# Patient Record
Sex: Male | Born: 1950 | Hispanic: No | Marital: Married | Smoking: Never smoker
Health system: Southern US, Community
[De-identification: ages and names within clinical notes are randomized; demographics above are authoritative.]

## PROBLEM LIST (undated history)

## (undated) DIAGNOSIS — I10 Essential (primary) hypertension: Secondary | ICD-10-CM

## (undated) DIAGNOSIS — E7800 Pure hypercholesterolemia, unspecified: Secondary | ICD-10-CM

## (undated) HISTORY — PX: HERNIA REPAIR: SHX51

---

## 2006-06-21 ENCOUNTER — Ambulatory Visit: Payer: Self-pay | Admitting: Gastroenterology

## 2011-06-06 ENCOUNTER — Ambulatory Visit: Payer: Self-pay | Admitting: Surgery

## 2011-06-06 DIAGNOSIS — I10 Essential (primary) hypertension: Secondary | ICD-10-CM

## 2011-06-13 ENCOUNTER — Ambulatory Visit: Payer: Self-pay | Admitting: Surgery

## 2012-03-20 ENCOUNTER — Ambulatory Visit: Payer: Self-pay | Admitting: Gastroenterology

## 2012-03-21 LAB — PATHOLOGY REPORT

## 2014-08-02 NOTE — Op Note (Signed)
PATIENT NAME:  Samuel Arnold, Samuel Arnold MR#:  [id number] DATE OF BIRTH:  [birth year]  DATE OF PROCEDURE:  06/13/2011  PREOPERATIVE DIAGNOSIS: Left inguinal hernia.   POSTOPERATIVE DIAGNOSIS: Left inguinal hernia.   PROCEDURE: Left inguinal hernia repair.   SURGEON: Rochel Brome, MD.   ANESTHESIA: General.   INDICATIONS: This 64 year old male came into the office with chief complaint of a large bulge in the left groin. He had a very large left inguinal hernia which at the time could not be reduced. It was some 18 cm in dimension and repair is recommended for definitive treatment.   DESCRIPTION OF PROCEDURE: The patient was placed on the operating table in the supine position under general anesthesia. The hernia at this time was reduced. The left lower abdomen was clipped and prepared with ChloraPrep and draped in a sterile manner. A left lower quadrant transversely oriented suprapubic incision was made approximately 8 cm in length, carried down through subcutaneous tissues. Two traversing veins were divided between 4-0 Vicryl ligatures. Scarpa's fascia was incised. The external oblique aponeurosis was incised along the course of its fibers to open the external ring and expose the inguinal cord structures. Cremaster fibers were spread to expose an indirect hernia sac which was dissected free from surrounding structures. The sac was approximately 8 inches in length. It was opened. Its continuity with the peritoneal cavity was demonstrated. There was some small bowel within the sac. The sac was dissected up well into the internal ring and then a high ligation of the sac was done with 0 Surgilon and then 0 Surgilon suture ligature. The sac was excised and did not need pathologic examination. The stump was allowed to retract. A cord lipoma was also ligated with 4-0 Vicryl and amputated. The cord structures were further mobilized. There was some mild weakness in the floor of the inguinal canal. A row of sutures were  placed using 0 Surgilon to suture the conjoined tendon to the shelving edge of the inguinal ligament incorporating transversalis fascia into the repair. The last stitch led to satisfactory narrowing of the internal ring. A medial relaxing incision was made in the internal oblique fascia overlying the distal rectus muscle. Next, an Atrium mesh was cut to create an oval shape of some 2.5 x 4 cm with a notch cut out for the internal ring. This was placed straddling the internal ring and sutured to the repair with 0 Surgilon. The repair looked good. Hemostasis was intact. The cord structures and the ilioinguinal nerve were placed along the floor of the inguinal canal. Cut edges of the external oblique aponeurosis were approximated with a running 4-0 Monocryl. The deep fascia superior and lateral to the repair site was infiltrated with 0.5% Sensorcaine with epinephrine. Subcutaneous tissues were infiltrated using a total of 18 mL. The Scarpa's fascia was closed with interrupted 4-0 Monocryl. The skin was closed with running 4-0 Monocryl subcuticular suture and Dermabond. The testicle remained in the scrotum. The patient tolerated surgery satisfactorily and was then prepared for transfer to the recovery room.  ____________________________ Lenna Sciara. Rochel Brome, MD jws:ap D: 06/13/2011 09:17:21 ET T: 06/13/2011 11:25:59 ET JOB#: [id number]  cc: Loreli Dollar, MD, <Dictator> Loreli Dollar MD ELECTRONICALLY SIGNED 06/16/2011 9:40

## 2015-10-26 ENCOUNTER — Encounter: Payer: Self-pay | Admitting: *Deleted

## 2015-10-27 ENCOUNTER — Encounter: Payer: Self-pay | Admitting: *Deleted

## 2015-10-27 ENCOUNTER — Encounter
Admission: RE | Disposition: A | Discharge status: Home / Self Care | Payer: Self-pay | Source: Ambulatory Visit | Attending: Unknown Physician Specialty

## 2015-10-27 ENCOUNTER — Ambulatory Visit: Payer: Medicare Other | Admitting: Anesthesiology

## 2015-10-27 ENCOUNTER — Ambulatory Visit
Admission: RE | Admit: 2015-10-27 | Discharge: 2015-10-27 | Disposition: A | Discharge status: Home / Self Care | Payer: Medicare Other | Source: Ambulatory Visit | Attending: Unknown Physician Specialty | Admitting: Unknown Physician Specialty

## 2015-10-27 DIAGNOSIS — E7800 Pure hypercholesterolemia, unspecified: Secondary | ICD-10-CM | POA: Insufficient documentation

## 2015-10-27 DIAGNOSIS — Z7982 Long term (current) use of aspirin: Secondary | ICD-10-CM | POA: Insufficient documentation

## 2015-10-27 DIAGNOSIS — D122 Benign neoplasm of ascending colon: Secondary | ICD-10-CM | POA: Diagnosis not present

## 2015-10-27 DIAGNOSIS — K640 First degree hemorrhoids: Secondary | ICD-10-CM | POA: Insufficient documentation

## 2015-10-27 DIAGNOSIS — Z9889 Other specified postprocedural states: Secondary | ICD-10-CM | POA: Diagnosis not present

## 2015-10-27 DIAGNOSIS — Z86010 Personal history of colonic polyps: Secondary | ICD-10-CM | POA: Insufficient documentation

## 2015-10-27 DIAGNOSIS — Z1211 Encounter for screening for malignant neoplasm of colon: Secondary | ICD-10-CM | POA: Diagnosis not present

## 2015-10-27 DIAGNOSIS — I10 Essential (primary) hypertension: Secondary | ICD-10-CM | POA: Diagnosis not present

## 2015-10-27 DIAGNOSIS — Z79899 Other long term (current) drug therapy: Secondary | ICD-10-CM | POA: Diagnosis not present

## 2015-10-27 HISTORY — DX: Essential (primary) hypertension: I10

## 2015-10-27 HISTORY — DX: Pure hypercholesterolemia, unspecified: E78.00

## 2015-10-27 HISTORY — PX: COLONOSCOPY WITH PROPOFOL: SHX5780

## 2015-10-27 SURGERY — COLONOSCOPY WITH PROPOFOL
Anesthesia: General

## 2015-10-27 MED ORDER — EPHEDRINE SULFATE 50 MG/ML IJ SOLN
INTRAMUSCULAR | Status: DC | PRN
  Administered 2015-10-27: 10 mg via INTRAVENOUS

## 2015-10-27 MED ORDER — LIDOCAINE HCL (PF) 2 % IJ SOLN
INTRAMUSCULAR | Status: DC | PRN
  Administered 2015-10-27: 50 mg

## 2015-10-27 MED ORDER — FENTANYL CITRATE (PF) 100 MCG/2ML IJ SOLN
INTRAMUSCULAR | Status: DC | PRN
  Administered 2015-10-27: 50 ug via INTRAVENOUS

## 2015-10-27 MED ORDER — SODIUM CHLORIDE 0.9 % IV SOLN
INTRAVENOUS | Status: DC
  Administered 2015-10-27: 1000 mL via INTRAVENOUS

## 2015-10-27 MED ORDER — SODIUM CHLORIDE 0.9 % IV SOLN
INTRAVENOUS | Status: DC
  Administered 2015-10-27: 09:00:00 via INTRAVENOUS

## 2015-10-27 MED ORDER — PROPOFOL 10 MG/ML IV BOLUS
INTRAVENOUS | Status: DC | PRN
  Administered 2015-10-27: 40 mg via INTRAVENOUS

## 2015-10-27 MED ORDER — PROPOFOL 500 MG/50ML IV EMUL
INTRAVENOUS | Status: DC | PRN
  Administered 2015-10-27: 75 ug/kg/min via INTRAVENOUS

## 2015-10-27 MED ORDER — MIDAZOLAM HCL 5 MG/5ML IJ SOLN
INTRAMUSCULAR | Status: DC | PRN
  Administered 2015-10-27: 1 mg via INTRAVENOUS

## 2015-10-27 NOTE — Anesthesia Preprocedure Evaluation (Signed)
Anesthesia Evaluation  Patient identified by MRN, date of birth, ID band Patient awake    Reviewed: Allergy & Precautions, H&P , NPO status , Patient's Chart, lab work & pertinent test results, reviewed documented beta blocker date and time   History of Anesthesia Complications Negative for: history of anesthetic complications  Airway Mallampati: III  TM Distance: >3 FB Neck ROM: full    Dental no notable dental hx. (+) Partial Upper, Partial Lower   Pulmonary neg pulmonary ROS,    Pulmonary exam normal breath sounds clear to auscultation       Cardiovascular Exercise Tolerance: Good hypertension, On Medications (-) angina(-) CAD, (-) Past MI, (-) Cardiac Stents and (-) CABG Normal cardiovascular exam(-) dysrhythmias (-) Valvular Problems/Murmurs Rhythm:regular Rate:Normal     Neuro/Psych negative neurological ROS  negative psych ROS   GI/Hepatic negative GI ROS, Neg liver ROS,   Endo/Other  negative endocrine ROS  Renal/GU negative Renal ROS  negative genitourinary   Musculoskeletal   Abdominal   Peds  Hematology negative hematology ROS (+)   Anesthesia Other Findings Past Medical History:   Hypertension                                                 Hypercholesteremia                                           Reproductive/Obstetrics negative OB ROS                             Anesthesia Physical Anesthesia Plan  ASA: II  Anesthesia Plan: General   Post-op Pain Management:    Induction:   Airway Management Planned:   Additional Equipment:   Intra-op Plan:   Post-operative Plan:   Informed Consent: I have reviewed the patients History and Physical, chart, labs and discussed the procedure including the risks, benefits and alternatives for the proposed anesthesia with the patient or authorized representative who has indicated his/her understanding and acceptance.   Dental  Advisory Given  Plan Discussed with: Anesthesiologist, CRNA and Surgeon  Anesthesia Plan Comments:         Anesthesia Quick Evaluation

## 2015-10-27 NOTE — Transfer of Care (Signed)
Immediate Anesthesia Transfer of Care Note  Patient: Samuel Arnold  Procedure(s) Performed: Procedure(s): COLONOSCOPY WITH PROPOFOL (N/A)  Patient Location: PACU  Anesthesia Type:General  Level of Consciousness: sedated  Airway & Oxygen Therapy: Patient Spontanous Breathing and Patient connected to nasal cannula oxygen  Post-op Assessment: Report given to RN and Post -op Vital signs reviewed and stable  Post vital signs: Reviewed and stable  Last Vitals:  Filed Vitals:   10/27/15 0808 10/27/15 0900  BP: 166/77 104/68  Pulse: 56 53  Temp: 36.8 C 36.4 C  Resp: 16 18    Last Pain: There were no vitals filed for this visit.       Complications: No apparent anesthesia complications

## 2015-10-27 NOTE — H&P (Signed)
   Primary Care Physician:  [facility] Primary Gastroenterologist:  Dr. Vira Agar  Pre-Procedure History & Physical: HPI:  Samuel Arnold is a 65 y.o. male is here for an colonoscopy.   Past Medical History  Diagnosis Date  . Hypertension   . Hypercholesteremia     Past Surgical History  Procedure Laterality Date  . Hernia repair      Prior to Admission medications   Medication Sig Start Date End Date Taking? Authorizing Provider  aspirin 81 MG tablet Take 81 mg by mouth daily.   Yes Historical Provider, MD  atenolol (TENORMIN) 25 MG tablet Take by mouth daily.   Yes Historical Provider, MD  chlorthalidone (HYGROTON) 25 MG tablet Take 25 mg by mouth daily.   Yes Historical Provider, MD  lovastatin (MEVACOR) 40 MG tablet Take 40 mg by mouth at bedtime.   Yes Historical Provider, MD  sildenafil (VIAGRA) 100 MG tablet Take 100 mg by mouth daily as needed for erectile dysfunction.   Yes Historical Provider, MD    Allergies as of 09/30/2015  . (Not on File)    History reviewed. No pertinent family history.  Social History   Social History  . Marital Status: Married    Spouse Name: N/A  . Number of Children: N/A  . Years of Education: N/A   Occupational History  . Not on file.   Social History Main Topics  . Smoking status: Never Smoker   . Smokeless tobacco: Not on file  . Alcohol Use: Not on file  . Drug Use: Not on file  . Sexual Activity: Not on file   Other Topics Concern  . Not on file   Social History Narrative    Review of Systems: See HPI, otherwise negative ROS  Physical Exam: BP 166/77 mmHg  Pulse 56  Temp(Src) 98.2 F (36.8 C) (Oral)  Resp 16  Ht 5\' 7"  (1.702 m)  Wt 90.719 kg (200 lb)  BMI 31.32 kg/m2  SpO2 100% General:   Alert,  pleasant and cooperative in NAD Head:  Normocephalic and atraumatic. Neck:  Supple; no masses or thyromegaly. Lungs:  Clear throughout to auscultation.    Heart:  Regular rate and  rhythm. Abdomen:  Soft, nontender and nondistended. Normal bowel sounds, without guarding, and without rebound.   Neurologic:  Alert and  oriented x4;  grossly normal neurologically.  Impression/Plan: Samuel Arnold is here for an colonoscopy to be performed for [facility] colon polyps  Risks, benefits, limitations, and alternatives regarding  colonoscopy have been reviewed with the patient.  Questions have been answered.  All parties agreeable.   Gaylyn Cheers, MD  10/27/2015, 8:23 AM

## 2015-10-27 NOTE — Anesthesia Postprocedure Evaluation (Signed)
Anesthesia Post Note  Patient: Samuel Arnold  Procedure(s) Performed: Procedure(s) (LRB): COLONOSCOPY WITH PROPOFOL (N/A)  Patient location during evaluation: Endoscopy Anesthesia Type: General Level of consciousness: awake and alert Pain management: pain level controlled Vital Signs Assessment: post-procedure vital signs reviewed and stable Respiratory status: spontaneous breathing, nonlabored ventilation, respiratory function stable and patient connected to nasal cannula oxygen Cardiovascular status: blood pressure returned to baseline and stable Postop Assessment: no signs of nausea or vomiting Anesthetic complications: no    Last Vitals:  Filed Vitals:   10/27/15 0920 10/27/15 0930  BP: 135/74 137/75  Pulse: 53 54  Temp:    Resp: 12 15    Last Pain: There were no vitals filed for this visit.               Martha Clan

## 2015-10-27 NOTE — Op Note (Signed)
[facility] Gastroenterology Patient Name: Samuel Arnold Procedure Date: 10/27/2015 8:32 AM MRN: [id number] Account #: [id number] Date of Birth: Aug 16, 1950 Admit Type: Outpatient Age: 65 Room: [facility] ENDO ROOM 4 Gender: Male Note Status: Finalized Procedure:            Colonoscopy Indications:          High risk colon cancer surveillance: Personal history                        of colonic polyps Providers:            Manya Silvas, MD Referring MD:         Holly Bodily, MD Medicines:            Propofol per Anesthesia Complications:        No immediate complications. Procedure:            Pre-Anesthesia Assessment:                       - After reviewing the risks and benefits, the patient                        was deemed in satisfactory condition to undergo the                        procedure.                       After obtaining informed consent, the colonoscope was                        passed under direct vision. Throughout the procedure,                        the patient's blood pressure, pulse, and oxygen                        saturations were monitored continuously. The                        Colonoscope was introduced through the anus and                        advanced to the the cecum, identified by appendiceal                        orifice and ileocecal valve. The colonoscopy was                        performed without difficulty. The patient tolerated the                        procedure well. The quality of the bowel preparation                        was good. Findings:      A small polyp was found in the ascending colon. The polyp was sessile.       The polyp was removed with a hot snare. Resection and retrieval were       complete.      Internal hemorrhoids were found during endoscopy. The hemorrhoids  were       small and Grade I (internal hemorrhoids that do not prolapse).      The exam was otherwise without abnormality. Impression:            - One small polyp in the ascending colon, removed with                        a hot snare. Resected and retrieved.                       - Internal hemorrhoids.                       - The examination was otherwise normal. Recommendation:       - Await pathology results. Manya Silvas, MD 10/27/2015 8:59:30 AM This report has been signed electronically. Number of Addenda: 0 Note Initiated On: 10/27/2015 8:32 AM Scope Withdrawal Time: 0 hours 11 minutes 39 seconds  Total Procedure Duration: 0 hours 19 minutes 50 seconds       [facility]

## 2015-10-28 ENCOUNTER — Encounter: Payer: Self-pay | Admitting: Unknown Physician Specialty

## 2015-10-28 LAB — SURGICAL PATHOLOGY

## 2017-02-06 ENCOUNTER — Ambulatory Visit: Payer: Medicare HMO | Admitting: Urology

## 2017-02-06 ENCOUNTER — Encounter: Payer: Self-pay | Admitting: Urology

## 2017-02-06 VITALS — BP 178/93 | HR 69 | Ht 67.0 in | Wt 215.0 lb

## 2017-02-06 DIAGNOSIS — R972 Elevated prostate specific antigen [PSA]: Secondary | ICD-10-CM | POA: Diagnosis not present

## 2017-02-06 LAB — URINALYSIS, COMPLETE
BILIRUBIN UA: NEGATIVE
Glucose, UA: NEGATIVE
Ketones, UA: NEGATIVE
Leukocytes, UA: NEGATIVE
Nitrite, UA: NEGATIVE
PH UA: 7 (ref 5.0–7.5)
PROTEIN UA: NEGATIVE
Specific Gravity, UA: <1.005 — ABNORMAL LOW (ref 1.005–1.030)
Urobilinogen, Ur: 0.2 mg/dL (ref 0.2–1.0)

## 2017-02-06 MED ORDER — TAMSULOSIN HCL 0.4 MG PO CAPS: 0.4000 mg | ORAL_CAPSULE | Freq: Every day | ORAL | 0 refills | Status: DC

## 2017-02-06 NOTE — Progress Notes (Signed)
02/06/2017 3:11 PM   Samuel Arnold 1950-11-11 [id number]  Referring provider: [facility] [place], [place] [address]  Chief Complaint  Patient presents with  . Elevated PSA    New Patient    HPI: Samuel Arnold is a 66 y.o. male seen in consultation at the request of Burman Freestone for evaluation of an elevated PSA.  Recent PSA was elevated at 6.1.  No previous PSA results were sent for comparison however the patient states he was told his PSA had approximately doubled from last year.  He denies previous history of prior urologic evaluation and has no bothersome lower urinary tract symptoms.  He denies dysuria or gross hematuria.  Denies flank, abdominal, pelvic or scrotal pain.  He uses sildenafil as needed for erectile dysfunction.  He rates the severity of his problem as mild.   PMH: Past Medical History:  Diagnosis Date  . Hypercholesteremia   . Hypertension     Surgical History: Past Surgical History:  Procedure Laterality Date  . COLONOSCOPY WITH PROPOFOL N/A 10/27/2015   Procedure: COLONOSCOPY WITH PROPOFOL;  Surgeon: Manya Silvas, MD;  Location: [facility] ENDOSCOPY;  Service: Endoscopy;  Laterality: N/A;  . HERNIA REPAIR      Home Medications:  Allergies as of 02/06/2017   No Known Allergies     Medication List       Accurate as of 02/06/17  3:11 PM. Always use your most recent med list.          aspirin 81 MG tablet Take 81 mg by mouth daily.   atenolol 25 MG tablet Commonly known as:  TENORMIN Take by mouth daily.   chlorthalidone 25 MG tablet Commonly known as:  HYGROTON Take 25 mg by mouth daily.   lovastatin 40 MG tablet Commonly known as:  MEVACOR Take 40 mg by mouth at bedtime.   sildenafil 100 MG tablet Commonly known as:  VIAGRA Take 100 mg by mouth daily as needed for erectile dysfunction.       Allergies: No Known Allergies  Family History: Family History  Problem Relation Age of Onset  .  Bladder Cancer Neg Hx   . Prostate cancer Neg Hx   . Kidney cancer Neg Hx     Social History:  reports that he has never smoked. He has never used smokeless tobacco. He reports that he does not drink alcohol or use drugs.  ROS: UROLOGY Frequent Urination?: Yes Hard to postpone urination?: No Burning/pain with urination?: No Get up at night to urinate?: Yes Leakage of urine?: No Urine stream starts and stops?: No Trouble starting stream?: No Do you have to strain to urinate?: No Blood in urine?: No Urinary tract infection?: No Sexually transmitted disease?: No Injury to kidneys or bladder?: No Painful intercourse?: No Weak stream?: No Erection problems?: No Penile pain?: No  Gastrointestinal Nausea?: No Vomiting?: No Indigestion/heartburn?: No Diarrhea?: No Constipation?: No  Constitutional Fever: No Night sweats?: No Weight loss?: No Fatigue?: No  Skin Skin rash/lesions?: No Itching?: No  Eyes Blurred vision?: No Double vision?: No  Ears/Nose/Throat Sore throat?: No Sinus problems?: No  Hematologic/Lymphatic Swollen glands?: No Easy bruising?: No  Cardiovascular Leg swelling?: No Chest pain?: No  Respiratory Cough?: No Shortness of breath?: No  Endocrine Excessive thirst?: No  Musculoskeletal Back pain?: No Joint pain?: No  Neurological Headaches?: No Dizziness?: No  Psychologic Depression?: No Anxiety?: No  Physical Exam: BP (!) 178/93 (BP Location: Left Arm, Patient Position:  Sitting, Cuff Size: Large)   Pulse 69   Ht 5\' 7"  (1.702 m)   Wt 215 lb (97.5 kg)   BMI 33.67 kg/m   Constitutional:  Alert and oriented, No acute distress. HEENT: [place] AT, moist mucus membranes.  Trachea midline, no masses. Cardiovascular: No clubbing, cyanosis, or edema. Respiratory: Normal respiratory effort, no increased work of breathing. GI: Abdomen is soft, nontender, nondistended, no abdominal masses GU: No CVA tenderness.  Penis uncircumcised  without lesions, testes descended bilaterally without masses, cord/epididymes palpably normal.  Prostate at least 30 g, smooth.  Unable to palpate upper one half of gland. Skin: No rashes, bruises or suspicious lesions. Lymph: No cervical or inguinal adenopathy. Neurologic: Grossly intact, no focal deficits, moving all 4 extremities. Psychiatric: Normal mood and affect.  Laboratory Data:  Urinalysis Dipstick and microscopic negative    Assessment & Plan:   1. Elevated PSA Although PSA is a prostate cancer screening test he was informed that cancer is not the most common cause of an elevated PSA. Other potential causes including BPH and inflammation were discussed. He was informed that the only way to adequately diagnose prostate cancer would be a transrectal ultrasound and biopsy of the prostate. The procedure was discussed including potential risks of bleeding and infection/sepsis. He was also informed that a negative biopsy does not conclusively rule out the possibility that prostate cancer may be present and that continued monitoring is required. The use of newer adjunctive blood tests including PHI and 4kScore were discussed. The use of multiparametric prostate MRI was also discussed however is not typically used for initial evaluation of an elevated PSA. Continued periodic surveillance was also discussed. Will initially treat with a 30-day alpha blocker course and repeat his PSA in 1 month.  Would recommend prostate biopsy for a persistently elevated PSA.  - Urinalysis, Complete    Abbie Sons, MD  [facility] [address] [place], [place] [address] [phone]

## 2017-03-09 ENCOUNTER — Other Ambulatory Visit: Payer: Medicare HMO

## 2017-03-09 DIAGNOSIS — R972 Elevated prostate specific antigen [PSA]: Secondary | ICD-10-CM

## 2017-03-10 LAB — PSA: PROSTATE SPECIFIC AG, SERUM: 5.9 ng/mL — AB (ref 0.0–4.0)

## 2017-03-13 ENCOUNTER — Telehealth: Payer: Self-pay

## 2017-03-13 NOTE — Telephone Encounter (Signed)
Spoke with pt in reference to PSA results. Made aware should have prostate bx. Pt voiced understanding. Bx and results appts made. Reviewed prep instructions. Appts and instructions mailed to pt.

## 2017-03-13 NOTE — Telephone Encounter (Signed)
-----   Message from Abbie Sons, MD sent at 03/13/2017 12:46 PM EST ----- Repeat PSA remains elevated at 5.9.  Recommend scheduling prostate biopsy.

## 2017-03-14 ENCOUNTER — Telehealth: Payer: Self-pay | Admitting: Urology

## 2017-03-21 NOTE — Telephone Encounter (Signed)
error 

## 2017-04-23 ENCOUNTER — Encounter: Payer: Self-pay | Admitting: Urology

## 2017-04-23 ENCOUNTER — Ambulatory Visit: Payer: Medicare HMO | Admitting: Urology

## 2017-04-23 ENCOUNTER — Other Ambulatory Visit: Payer: Self-pay | Admitting: Urology

## 2017-04-23 VITALS — BP 158/76 | HR 69 | Ht 67.0 in | Wt 212.6 lb

## 2017-04-23 DIAGNOSIS — R972 Elevated prostate specific antigen [PSA]: Secondary | ICD-10-CM | POA: Diagnosis not present

## 2017-04-23 MED ORDER — GENTAMICIN SULFATE 40 MG/ML IJ SOLN
80.0000 mg | Freq: Once | INTRAMUSCULAR | Status: AC
  Administered 2017-04-23: 80 mg via INTRAMUSCULAR

## 2017-04-23 MED ORDER — LIDOCAINE HCL 2 % EX GEL
1.0000 "application " | Freq: Once | CUTANEOUS | Status: AC
  Administered 2017-04-23: 1 via URETHRAL

## 2017-04-23 MED ORDER — LEVOFLOXACIN 500 MG PO TABS
500.0000 mg | ORAL_TABLET | Freq: Once | ORAL | Status: AC
  Administered 2017-04-23: 500 mg via ORAL

## 2017-04-23 NOTE — Progress Notes (Signed)
Prostate Biopsy Procedure   Informed consent was obtained after discussing risks/benefits of the procedure.  A time out was performed to ensure correct patient identity.  Pre-Procedure: - Last PSA Level: 5.9 03/09/2017 - Gentamicin given prophylactically - Levaquin 500 mg administered PO -Transrectal Ultrasound performed revealing a 34 gm prostate -No significant hypoechoic or median lobe noted  Procedure: - Prostate block performed using 10 cc 1% lidocaine and biopsies taken from sextant areas, a total of 12 under ultrasound guidance.  Post-Procedure: - Patient tolerated the procedure well - He was counseled to seek immediate medical attention if experiences any severe pain, significant bleeding, or fevers - Return in one week to discuss biopsy results

## 2017-04-25 ENCOUNTER — Encounter: Payer: Self-pay | Admitting: Urology

## 2017-04-27 LAB — PATHOLOGY REPORT

## 2017-04-30 ENCOUNTER — Other Ambulatory Visit: Payer: Self-pay | Admitting: Urology

## 2017-05-07 ENCOUNTER — Encounter: Payer: Self-pay | Admitting: Urology

## 2017-05-07 ENCOUNTER — Ambulatory Visit (INDEPENDENT_AMBULATORY_CARE_PROVIDER_SITE_OTHER): Payer: Medicare HMO | Admitting: Urology

## 2017-05-07 VITALS — BP 179/73 | HR 74 | Ht 67.0 in | Wt 212.0 lb

## 2017-05-07 DIAGNOSIS — C61 Malignant neoplasm of prostate: Secondary | ICD-10-CM | POA: Diagnosis not present

## 2017-05-07 NOTE — Progress Notes (Signed)
05/07/2017 12:54 PM   Samuel Arnold 1951-02-11 [id number]  Referring provider: [facility] [address] [place], [place] [address]  Chief Complaint  Patient presents with  . Prostate Cancer    Results    HPI: 67 year old male presents for prostate biopsy follow-up. Prostate biopsy was performed on 04/23/2017 for a PSA of 5.9.  Prostate volume was calculated at 34 cc.  No peripheral zone abnormalities were noted.  Standard 12 core biopsies were performed.  He had no post biopsy complaints and has no complaints today.  Pathology: A single core from the right mid prostate is remarkable for a focus of Gleason 3+3 adenocarcinoma involving 9% of the submitted tissue.  All remaining cores demonstrate benign prostate tissue.   PMH: Past Medical History:  Diagnosis Date  . Hypercholesteremia   . Hypertension     Surgical History: Past Surgical History:  Procedure Laterality Date  . COLONOSCOPY WITH PROPOFOL N/A 10/27/2015   Procedure: COLONOSCOPY WITH PROPOFOL;  Surgeon: Manya Silvas, MD;  Location: [facility] ENDOSCOPY;  Service: Endoscopy;  Laterality: N/A;  . HERNIA REPAIR      Home Medications:  Allergies as of 05/07/2017   No Known Allergies     Medication List        Accurate as of 05/07/17 12:54 PM. Always use your most recent med list.          aspirin 81 MG tablet Take 81 mg by mouth daily.   atenolol 25 MG tablet Commonly known as:  TENORMIN Take by mouth daily.   chlorthalidone 25 MG tablet Commonly known as:  HYGROTON Take 25 mg by mouth daily.   lovastatin 40 MG tablet Commonly known as:  MEVACOR Take 40 mg by mouth at bedtime.   sildenafil 100 MG tablet Commonly known as:  VIAGRA Take 100 mg by mouth daily as needed for erectile dysfunction.   tamsulosin 0.4 MG Caps capsule Commonly known as:  FLOMAX Take 1 capsule (0.4 mg total) by mouth daily.       Allergies: No Known Allergies  Family History: Family History    Problem Relation Age of Onset  . Bladder Cancer Neg Hx   . Prostate cancer Neg Hx   . Kidney cancer Neg Hx     Social History:  reports that  has never smoked. he has never used smokeless tobacco. He reports that he does not drink alcohol or use drugs.  ROS: UROLOGY Frequent Urination?: No Hard to postpone urination?: No Burning/pain with urination?: No Get up at night to urinate?: No Leakage of urine?: No Urine stream starts and stops?: No Trouble starting stream?: No Do you have to strain to urinate?: No Blood in urine?: No Urinary tract infection?: No Sexually transmitted disease?: No Injury to kidneys or bladder?: No Painful intercourse?: No Weak stream?: No Erection problems?: Yes Penile pain?: No  Gastrointestinal Nausea?: No Vomiting?: No Indigestion/heartburn?: No Diarrhea?: No Constipation?: No  Constitutional Fever: No Night sweats?: No Weight loss?: No Fatigue?: No  Skin Skin rash/lesions?: No Itching?: No  Eyes Blurred vision?: No Double vision?: No  Ears/Nose/Throat Sore throat?: No Sinus problems?: No  Hematologic/Lymphatic Swollen glands?: No Easy bruising?: No  Cardiovascular Leg swelling?: No Chest pain?: No  Respiratory Cough?: No Shortness of breath?: No  Endocrine Excessive thirst?: No  Musculoskeletal Back pain?: No Joint pain?: No  Neurological Headaches?: No Dizziness?: No  Psychologic Depression?: No Anxiety?: No  Physical Exam: BP (!) 179/73   Pulse 74  Ht 5\' 7"  (1.702 m)   Wt 212 lb (96.2 kg)   BMI 33.20 kg/m   Constitutional:  Alert and oriented, No acute distress. HEENT: [place] AT, moist mucus membranes.  Trachea midline, no masses. Cardiovascular: No clubbing, cyanosis, or edema. Respiratory: Normal respiratory effort, no increased work of breathing.   Laboratory Data:   Lab Results  Component Value Date   PSA1 5.9 (H) 03/09/2017     Assessment & Plan:   1.  Clinical T1c adenocarcinoma  prostate, low risk The pathology report was discussed in detail.  No further metastatic evaluation is required based on his PSA, exam and pathology.  Standard curative treatment options were discussed including radical prostatectomy and radiation modalities.  He would also be a candidate for active surveillance.  The pros and cons of each treatment were discussed.  He was provided literature on prostate cancer.  He is fairly certain he wants to do active surveillance and states he will call back if he changes his mind.  We will tentatively set up an appointment in 3 months for PSA/DRE.  The recommendation of a follow-up repeat prostate biopsy in 9-12 months was discussed.  Return in about 3 months (around 08/05/2017) for Recheck, PSA.  Greater than 50% of this 15-minute visit was spent counseling the patient.    Abbie Sons, [place] [address], [place], [place] [address] [phone]

## 2017-07-31 ENCOUNTER — Other Ambulatory Visit: Payer: Medicare HMO

## 2017-07-31 DIAGNOSIS — C61 Malignant neoplasm of prostate: Secondary | ICD-10-CM

## 2017-07-31 NOTE — Addendum Note (Signed)
Addended by: Lestine Box on: 07/31/2017 02:35 PM   Modules accepted: Orders

## 2017-08-01 LAB — PSA: Prostate Specific Ag, Serum: 5.4 ng/mL — ABNORMAL HIGH (ref 0.0–4.0)

## 2017-08-08 ENCOUNTER — Ambulatory Visit (INDEPENDENT_AMBULATORY_CARE_PROVIDER_SITE_OTHER): Payer: Medicare HMO | Admitting: Urology

## 2017-08-08 ENCOUNTER — Encounter: Payer: Self-pay | Admitting: Urology

## 2017-08-08 VITALS — BP 139/71 | HR 67 | Ht 67.0 in | Wt 210.0 lb

## 2017-08-08 DIAGNOSIS — C61 Malignant neoplasm of prostate: Secondary | ICD-10-CM

## 2017-08-08 NOTE — Progress Notes (Signed)
08/08/2017 12:03 PM   Samuel Arnold [birth year] [id number]  Referring provider: [facility] [place], [place] [address]  Chief Complaint  Patient presents with  . Prostate Cancer    74month   Urologic problem list: -T1c adenocarcinoma prostate, low risk  Prostate biopsy January 2019 for PSA of 5.9; prostate volume 34 cc.  Single core from the right mid prostate with a focus of Gleason 3+3 adenocarcinoma involving 9%.  He elected active surveillance.  HPI: 67 year old male presents for follow-up of prostate cancer.  Since his last visit he states he has been doing well.  He has no bothersome lower urinary tract symptoms.  He remains on tamsulosin.  Denies dysuria or gross hematuria.  Denies flank, abdominal, pelvic or scrotal pain.  He has no bony pain.  Repeat PSA performed on 4/23 was stable at 5.4   PMH: Past Medical History:  Diagnosis Date  . Hypercholesteremia   . Hypertension     Surgical History: Past Surgical History:  Procedure Laterality Date  . COLONOSCOPY WITH PROPOFOL N/A 10/27/2015   Procedure: COLONOSCOPY WITH PROPOFOL;  Surgeon: Manya Silvas, MD;  Location: [facility] ENDOSCOPY;  Service: Endoscopy;  Laterality: N/A;  . HERNIA REPAIR      Home Medications:  Allergies as of 08/08/2017   No Known Allergies     Medication List        Accurate as of 08/08/17 12:03 PM. Always use your most recent med list.          aspirin 81 MG tablet Take 81 mg by mouth daily.   atenolol 25 MG tablet Commonly known as:  TENORMIN Take by mouth daily.   chlorthalidone 25 MG tablet Commonly known as:  HYGROTON Take 25 mg by mouth daily.   lisinopril 10 MG tablet Commonly known as:  PRINIVIL,ZESTRIL   lovastatin 40 MG tablet Commonly known as:  MEVACOR Take 40 mg by mouth at bedtime.   sildenafil 100 MG tablet Commonly known as:  VIAGRA Take 100 mg by mouth daily as needed for erectile dysfunction.   tamsulosin 0.4 MG Caps  capsule Commonly known as:  FLOMAX Take 1 capsule (0.4 mg total) by mouth daily.       Allergies: No Known Allergies  Family History: Family History  Problem Relation Age of Onset  . Bladder Cancer Neg Hx   . Prostate cancer Neg Hx   . Kidney cancer Neg Hx     Social History:  reports that he has never smoked. He has never used smokeless tobacco. He reports that he does not drink alcohol or use drugs.  ROS: UROLOGY Frequent Urination?: No Hard to postpone urination?: No Burning/pain with urination?: No Get up at night to urinate?: Yes Leakage of urine?: No Urine stream starts and stops?: No Trouble starting stream?: No Do you have to strain to urinate?: No Blood in urine?: No Urinary tract infection?: No Sexually transmitted disease?: No Injury to kidneys or bladder?: No Painful intercourse?: No Weak stream?: No Erection problems?: No Penile pain?: No  Gastrointestinal Nausea?: No Vomiting?: No Indigestion/heartburn?: No Diarrhea?: No Constipation?: No  Constitutional Fever: No Night sweats?: No Weight loss?: No Fatigue?: No  Skin Skin rash/lesions?: No Itching?: No  Eyes Blurred vision?: No Double vision?: No  Ears/Nose/Throat Sore throat?: No Sinus problems?: No  Hematologic/Lymphatic Swollen glands?: No Easy bruising?: No  Cardiovascular Leg swelling?: No Chest pain?: No  Respiratory Cough?: No Shortness of breath?: No  Endocrine Excessive  thirst?: No  Musculoskeletal Back pain?: No Joint pain?: No  Neurological Headaches?: No Dizziness?: No  Psychologic Depression?: No Anxiety?: No  Physical Exam: BP 139/71   Pulse 67   Ht 5\' 7"  (1.702 m)   Wt 210 lb (95.3 kg)   BMI 32.89 kg/m   Constitutional:  Alert and oriented, No acute distress. HEENT: [place] AT, moist mucus membranes.  Trachea midline, no masses. Cardiovascular: No clubbing, cyanosis, or edema. Respiratory: Normal respiratory effort, no increased work of  breathing. GI: Abdomen is soft, nontender, nondistended, no abdominal masses GU: No CVA tenderness.  Prostate 30 g, smooth without nodules Lymph: No cervical or inguinal lymphadenopathy. Skin: No rashes, bruises or suspicious lesions. Neurologic: Grossly intact, no focal deficits, moving all 4 extremities. Psychiatric: Normal mood and affect.    Assessment & Plan:   Low risk adenocarcinoma the prostate on active surveillance. PSA and DRE are stable.  He desires to continue active surveillance and will follow-up in 3 months for a PSA/DRE.  We discussed recommendations for a confirmatory prostate biopsy in 6-9 months.   Return in about 3 months (around 11/08/2017) for Recheck, PSA.    Abbie Sons, [place] [address] [place], [place] [address] [phone]

## 2017-10-30 ENCOUNTER — Other Ambulatory Visit: Payer: Self-pay | Admitting: Family Medicine

## 2017-10-30 DIAGNOSIS — C61 Malignant neoplasm of prostate: Secondary | ICD-10-CM

## 2017-11-01 ENCOUNTER — Other Ambulatory Visit: Payer: Medicare HMO

## 2017-11-01 DIAGNOSIS — C61 Malignant neoplasm of prostate: Secondary | ICD-10-CM

## 2017-11-02 LAB — PSA: PROSTATE SPECIFIC AG, SERUM: 6.1 ng/mL — AB (ref 0.0–4.0)

## 2017-11-05 ENCOUNTER — Telehealth: Payer: Self-pay

## 2017-11-05 NOTE — Telephone Encounter (Signed)
Left message for pt

## 2017-11-05 NOTE — Telephone Encounter (Signed)
-----   Message from Abbie Sons, MD sent at 11/04/2017  9:57 AM EDT ----- PSA stable at 6.1

## 2017-11-08 ENCOUNTER — Ambulatory Visit (INDEPENDENT_AMBULATORY_CARE_PROVIDER_SITE_OTHER): Payer: Medicare HMO | Admitting: Urology

## 2017-11-08 ENCOUNTER — Encounter: Payer: Self-pay | Admitting: Urology

## 2017-11-08 VITALS — BP 167/84 | HR 63 | Ht 67.0 in | Wt 206.2 lb

## 2017-11-08 DIAGNOSIS — C61 Malignant neoplasm of prostate: Secondary | ICD-10-CM

## 2017-11-08 NOTE — Progress Notes (Signed)
11/08/2017 1:22 PM   Samuel Arnold [birth year] [id number]  Referring provider: [facility] [place], [place] [address]  Chief Complaint  Patient presents with  . Prostate Cancer   Urologic problem list: -T1c adenocarcinoma prostate, low risk             Prostate biopsy January 2019 for PSA of 5.9; prostate volume 34 cc.  Single core from the right mid prostate with a focus of Gleason 3+3 adenocarcinoma involving 9%.  He elected active surveillance.   HPI: 67 year old male presents for follow-up of the above problem list.  He has had no problems since his last visit.  He has no bothersome lower urinary tract symptoms.  Follow-up PSA drawn on 11/01/2017 was 6.1.   PMH: Past Medical History:  Diagnosis Date  . Hypercholesteremia   . Hypertension     Surgical History: Past Surgical History:  Procedure Laterality Date  . COLONOSCOPY WITH PROPOFOL N/A 10/27/2015   Procedure: COLONOSCOPY WITH PROPOFOL;  Surgeon: Manya Silvas, MD;  Location: [facility] ENDOSCOPY;  Service: Endoscopy;  Laterality: N/A;  . HERNIA REPAIR      Home Medications:  Allergies as of 11/08/2017   No Known Allergies     Medication List        Accurate as of 11/08/17  1:22 PM. Always use your most recent med list.          aspirin 81 MG tablet Take 81 mg by mouth daily.   atenolol 25 MG tablet Commonly known as:  TENORMIN Take by mouth daily.   chlorthalidone 25 MG tablet Commonly known as:  HYGROTON Take 25 mg by mouth daily.   lisinopril 10 MG tablet Commonly known as:  PRINIVIL,ZESTRIL   lovastatin 40 MG tablet Commonly known as:  MEVACOR Take 40 mg by mouth at bedtime.   sildenafil 100 MG tablet Commonly known as:  VIAGRA Take 100 mg by mouth daily as needed for erectile dysfunction.       Allergies: No Known Allergies  Family History: Family History  Problem Relation Age of Onset  . Bladder Cancer Neg Hx   . Prostate cancer Neg Hx   .  Kidney cancer Neg Hx     Social History:  reports that he has never smoked. He has never used smokeless tobacco. He reports that he does not drink alcohol or use drugs.  ROS: UROLOGY Frequent Urination?: No Hard to postpone urination?: No Burning/pain with urination?: No Get up at night to urinate?: No Leakage of urine?: No Urine stream starts and stops?: No Trouble starting stream?: No Do you have to strain to urinate?: No Blood in urine?: No Urinary tract infection?: No Sexually transmitted disease?: No Injury to kidneys or bladder?: No Painful intercourse?: No Weak stream?: No Erection problems?: No Penile pain?: No  Gastrointestinal Nausea?: No Vomiting?: No Indigestion/heartburn?: No Diarrhea?: No Constipation?: No  Constitutional Fever: No Night sweats?: No Weight loss?: No Fatigue?: No  Skin Skin rash/lesions?: No Itching?: No  Eyes Blurred vision?: No Double vision?: No  Ears/Nose/Throat Sore throat?: No Sinus problems?: No  Hematologic/Lymphatic Swollen glands?: No Easy bruising?: No  Cardiovascular Leg swelling?: No Chest pain?: No  Respiratory Cough?: No Shortness of breath?: No  Endocrine Excessive thirst?: No  Musculoskeletal Back pain?: No Joint pain?: No  Neurological Headaches?: No Dizziness?: No  Psychologic Depression?: No Anxiety?: No  Physical Exam: BP (!) 167/84 (BP Location: Left Arm, Patient Position: Sitting, Cuff Size: Normal)  Pulse 63   Ht 5\' 7"  (1.702 m)   Wt 206 lb 3.2 oz (93.5 kg)   BMI 32.30 kg/m   Constitutional:  Alert and oriented, No acute distress. HEENT: [place] AT, moist mucus membranes.  Trachea midline, no masses. Cardiovascular: No clubbing, cyanosis, or edema. Respiratory: Normal respiratory effort, no increased work of breathing. GI: Abdomen is soft, nontender, nondistended, no abdominal masses GU: No CVA tenderness.  Prostate 30 g, smooth without nodules. Lymph: No cervical or inguinal  lymphadenopathy. Skin: No rashes, bruises or suspicious lesions. Neurologic: Grossly intact, no focal deficits, moving all 4 extremities. Psychiatric: Normal mood and affect.   Assessment & Plan:   67 year old male with low risk prostate cancer on active surveillance.  PSA is stable.  Follow-up 3 months for PSA/DRE.  Will discuss prostate MRI and/or confirmatory biopsy at next visit.   Abbie Sons, [place] [address], [place], [place] [address] [phone]

## 2018-02-04 ENCOUNTER — Other Ambulatory Visit: Payer: Medicare HMO

## 2018-02-04 DIAGNOSIS — C61 Malignant neoplasm of prostate: Secondary | ICD-10-CM

## 2018-02-05 LAB — PSA: Prostate Specific Ag, Serum: 6.2 ng/mL — ABNORMAL HIGH (ref 0.0–4.0)

## 2018-02-08 ENCOUNTER — Encounter: Payer: Self-pay | Admitting: Urology

## 2018-02-08 ENCOUNTER — Ambulatory Visit (INDEPENDENT_AMBULATORY_CARE_PROVIDER_SITE_OTHER): Payer: Medicare HMO | Admitting: Urology

## 2018-02-08 VITALS — BP 183/87 | HR 60 | Ht 67.0 in | Wt 205.4 lb

## 2018-02-08 DIAGNOSIS — C61 Malignant neoplasm of prostate: Secondary | ICD-10-CM

## 2018-02-08 NOTE — Progress Notes (Signed)
02/08/2018 2:33 PM   Samuel Arnold 01-31-1951 [id number]  Referring provider: [facility] [place], [place] [address]  Chief Complaint  Patient presents with  . Prostate Cancer    Urologic history: -T1cadenocarcinoma prostate, low risk Prostate biopsy January 2019 for PSA of 5.9; prostate volume 34 cc. Single core from the right mid prostate with a focus of Gleason 3+3 adenocarcinoma involving 9%. He elected active surveillance.  HPI: 67 year old male presents for follow-up.  He has no complaints.  PSA on 02/04/2018 was stable at 6.2.  Denies dysuria or gross hematuria.  Denies flank, abdominal, pelvic or scrotal pain.   PMH: Past Medical History:  Diagnosis Date  . Hypercholesteremia   . Hypertension     Surgical History: Past Surgical History:  Procedure Laterality Date  . COLONOSCOPY WITH PROPOFOL N/A 10/27/2015   Procedure: COLONOSCOPY WITH PROPOFOL;  Surgeon: Manya Silvas, MD;  Location: [facility] ENDOSCOPY;  Service: Endoscopy;  Laterality: N/A;  . HERNIA REPAIR      Home Medications:  Allergies as of 02/08/2018   No Known Allergies     Medication List        Accurate as of 02/08/18  2:33 PM. Always use your most recent med list.          aspirin 81 MG tablet Take 81 mg by mouth daily.   atenolol 25 MG tablet Commonly known as:  TENORMIN Take by mouth daily.   chlorthalidone 25 MG tablet Commonly known as:  HYGROTON Take 25 mg by mouth daily.   lisinopril 10 MG tablet Commonly known as:  PRINIVIL,ZESTRIL   lovastatin 40 MG tablet Commonly known as:  MEVACOR Take 40 mg by mouth at bedtime.   sildenafil 100 MG tablet Commonly known as:  VIAGRA Take 100 mg by mouth daily as needed for erectile dysfunction.       Allergies: No Known Allergies  Family History: Family History  Problem Relation Age of Onset  . Bladder Cancer Neg Hx   . Prostate cancer Neg Hx   . Kidney cancer Neg Hx      Social History:  reports that he has never smoked. He has never used smokeless tobacco. He reports that he does not drink alcohol or use drugs.  ROS: UROLOGY Frequent Urination?: Yes Hard to postpone urination?: No Burning/pain with urination?: No Get up at night to urinate?: Yes Leakage of urine?: No Urine stream starts and stops?: No Trouble starting stream?: No Do you have to strain to urinate?: No Blood in urine?: No Urinary tract infection?: No Sexually transmitted disease?: No Injury to kidneys or bladder?: No Painful intercourse?: No Weak stream?: No Erection problems?: No Penile pain?: No  Gastrointestinal Nausea?: No Vomiting?: No Indigestion/heartburn?: No Diarrhea?: No Constipation?: No  Constitutional Fever: No Night sweats?: No Weight loss?: No Fatigue?: No  Skin Skin rash/lesions?: No Itching?: No  Eyes Blurred vision?: No Double vision?: No  Ears/Nose/Throat Sore throat?: No Sinus problems?: No  Hematologic/Lymphatic Swollen glands?: No Easy bruising?: No  Cardiovascular Leg swelling?: No Chest pain?: No  Respiratory Cough?: No Shortness of breath?: No  Endocrine Excessive thirst?: No  Musculoskeletal Back pain?: No Joint pain?: No  Neurological Headaches?: No Dizziness?: No  Psychologic Depression?: No Anxiety?: No  Physical Exam: BP (!) 183/87 (BP Location: Left Arm, Patient Position: Sitting, Cuff Size: Large)   Pulse 60   Ht 5\' 7"  (1.702 m)   Wt 205 lb 6.4 oz (93.2 kg)  BMI 32.17 kg/m   Constitutional:  Alert and oriented, No acute distress. HEENT: [place] AT, moist mucus membranes.  Trachea midline, no masses. Cardiovascular: No clubbing, cyanosis, or edema. Respiratory: Normal respiratory effort, no increased work of breathing. GI: Abdomen is soft, nontender, nondistended, no abdominal masses GU: No CVA tenderness.  Prostate 30 g, smooth without nodules Lymph: No cervical or inguinal lymphadenopathy. Skin:  No rashes, bruises or suspicious lesions. Neurologic: Grossly intact, no focal deficits, moving all 4 extremities. Psychiatric: Normal mood and affect.   Assessment & Plan:   67 year old male with low risk prostate cancer on active surveillance.  He will be 1 year post diagnosis January 2020.  Recommend scheduling a prostate MRI and if suspicious areas noted fusion biopsy.  Standard confirmatory biopsy if MRI negative.   Abbie Sons, [place] [address] [place], [place] [address] [phone]

## 2018-04-24 ENCOUNTER — Ambulatory Visit
Admission: RE | Admit: 2018-04-24 | Discharge: 2018-04-24 | Disposition: A | Discharge status: Home / Self Care | Payer: Medicare HMO | Source: Ambulatory Visit | Attending: Urology | Admitting: Urology

## 2018-04-24 DIAGNOSIS — C61 Malignant neoplasm of prostate: Secondary | ICD-10-CM | POA: Diagnosis not present

## 2018-04-24 LAB — POCT I-STAT CREATININE: CREATININE: 1.3 mg/dL — AB (ref 0.61–1.24)

## 2018-04-24 MED ORDER — GADOBUTROL 1 MMOL/ML IV SOLN
9.0000 mL | Freq: Once | INTRAVENOUS | Status: AC | PRN
  Administered 2018-04-24: 9 mL via INTRAVENOUS

## 2018-05-01 ENCOUNTER — Telehealth: Payer: Self-pay | Admitting: Urology

## 2018-05-01 DIAGNOSIS — C61 Malignant neoplasm of prostate: Secondary | ICD-10-CM

## 2018-05-01 NOTE — Telephone Encounter (Signed)
Pt called asking for results of Prostate MRI.

## 2018-05-03 NOTE — Telephone Encounter (Signed)
Patient notified and voiced understanding.

## 2018-05-03 NOTE — Telephone Encounter (Signed)
Pt called back again and wants someone to call him with his MRI results please.

## 2018-05-03 NOTE — Telephone Encounter (Signed)
Prostate MRI showed 2 PI-RADS 4 lesions in the right prostate.  The findings were discussed with Mr. Kinnett and that this MRI abnormality is suspicious for a high-grade prostate cancer.  I recommended scheduling a fusion biopsy at [facility].  He will return here with the results.

## 2018-07-04 ENCOUNTER — Other Ambulatory Visit: Payer: Self-pay | Admitting: Urology

## 2018-07-10 ENCOUNTER — Telehealth: Payer: Self-pay | Admitting: Urology

## 2018-07-10 NOTE — Telephone Encounter (Signed)
Pt called back and confirmed appt for tomorrow

## 2018-07-10 NOTE — Telephone Encounter (Signed)
-----   Message from Abbie Sons, MD sent at 07/10/2018 12:21 PM EDT ----- Please schedule a virtual appointment tomorrow for prostate biopsy results.  Thanks

## 2018-07-10 NOTE — Telephone Encounter (Signed)
Lm for pt to cb to confirm app michelle

## 2018-07-11 ENCOUNTER — Telehealth: Payer: Self-pay | Admitting: Urology

## 2018-07-11 ENCOUNTER — Telehealth (INDEPENDENT_AMBULATORY_CARE_PROVIDER_SITE_OTHER): Payer: Medicare HMO | Admitting: Urology

## 2018-07-11 ENCOUNTER — Other Ambulatory Visit: Payer: Self-pay

## 2018-07-11 DIAGNOSIS — C61 Malignant neoplasm of prostate: Secondary | ICD-10-CM | POA: Diagnosis not present

## 2018-07-11 NOTE — Telephone Encounter (Signed)
The referral to the cancer center has been sent and Manuela Schwartz from scheduling will call him to schedule his bone scan. Does he need the follow up with the other MD now or after his results come back?   Sharyn Lull

## 2018-07-11 NOTE — Progress Notes (Signed)
Virtual Visit via Telephone Note  I connected with Samuel Arnold on 07/11/18 at 10:00 AM EDT by telephone and verified that I am speaking with the correct person using two identifiers.   I discussed the limitations, risks, security and privacy concerns of performing an evaluation and management service by telephone and the availability of in person appointments. We discussed the impact of the COVID-19 on the healthcare system, and the importance of social distancing and reducing patient and provider exposure. I also discussed with the patient that there may be a patient responsible charge related to this service. The patient expressed understanding and agreed to proceed.  Reason for visit: Pathology results  Urologic history: -T1cadenocarcinoma prostate, low risk Prostate biopsy January 2019 for PSA of 5.9; prostate volume 34 cc. Single core from the right mid prostate with a focus of Gleason 3+3 adenocarcinoma involving 9%. He elected active surveillance.  History of Present Illness: 68 year old male diagnosed with low risk prostate cancer January 2019.  A prostate MRI was recommended prior to confirmatory biopsy and this was remarkable for two PI-RADS 4 lesions; one in the right mid gland and the second in the right postero-medial peripheral zone.  He underwent MRI fusion biopsy at [facility] on 07/02/2018.  He had no post biopsy problems.  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  The ROI biopsies in the mid gland showed Gleason 4+4 adenocarcinoma involving 50% of 1 core and Gleason 4+3 and 2 other cores involving 40% and 10%.  The right lateral apical biopsy did show Gleason 3+3 adenocarcinoma involving 10%.   Assessment and Plan: The pathology report was discussed in detail with Samuel Arnold.  He has been upstaged from a low risk adenocarcinoma prostate to high risk adenocarcinoma the prostate.  The prostate MRI  showed no evidence of extracapsular extension or pelvic adenopathy.  Based on high risk disease will schedule a bone scan.  If negative we discussed curative options including radical prostatectomy and radiation therapy plus ADT including brachytherapy and IMRT.  The pros and cons of each treatment were discussed.  Follow Up: A bone scan will be ordered along with a referral to radiation oncology.  I also offered him an appointment with 1 of my partners to discuss RALP.   I discussed the assessment and treatment plan with the patient. The patient was provided an opportunity to ask questions and all were answered. The patient agreed with the plan and demonstrated an understanding of the instructions.   The patient was advised to call back or seek an in-person evaluation if the symptoms worsen or if the condition fails to improve as anticipated.  I provided 15 minutes of non-face-to-face time during this encounter.   Abbie Sons, MD

## 2018-07-12 NOTE — Telephone Encounter (Signed)
If you mean radiation oncology follow-up he can be seen before the bone scan results

## 2018-07-18 ENCOUNTER — Institutional Professional Consult (permissible substitution): Payer: Medicare Other | Admitting: Radiation Oncology

## 2018-08-05 ENCOUNTER — Other Ambulatory Visit: Payer: Medicare Other

## 2018-08-06 ENCOUNTER — Encounter
Admission: RE | Admit: 2018-08-06 | Discharge: 2018-08-06 | Disposition: A | Discharge status: Home / Self Care | Payer: Medicare HMO | Source: Ambulatory Visit | Attending: Urology | Admitting: Urology

## 2018-08-06 ENCOUNTER — Other Ambulatory Visit: Payer: Self-pay

## 2018-08-06 DIAGNOSIS — C61 Malignant neoplasm of prostate: Secondary | ICD-10-CM

## 2018-08-06 MED ORDER — TECHNETIUM TC 99M MEDRONATE IV KIT
23.0900 | PACK | Freq: Once | INTRAVENOUS | Status: AC | PRN
  Administered 2018-08-06: 11:00:00 23.09 via INTRAVENOUS

## 2018-08-08 ENCOUNTER — Encounter: Payer: Self-pay | Admitting: Radiation Oncology

## 2018-08-08 ENCOUNTER — Other Ambulatory Visit: Payer: Self-pay

## 2018-08-08 ENCOUNTER — Ambulatory Visit
Admission: RE | Admit: 2018-08-08 | Discharge: 2018-08-08 | Disposition: A | Discharge status: Home / Self Care | Payer: Medicare HMO | Source: Ambulatory Visit | Attending: Radiation Oncology | Admitting: Radiation Oncology

## 2018-08-08 VITALS — BP 174/80 | HR 62 | Temp 96.5°F | Resp 16 | Wt 215.2 lb

## 2018-08-08 DIAGNOSIS — E7800 Pure hypercholesterolemia, unspecified: Secondary | ICD-10-CM | POA: Insufficient documentation

## 2018-08-08 DIAGNOSIS — Z79899 Other long term (current) drug therapy: Secondary | ICD-10-CM | POA: Insufficient documentation

## 2018-08-08 DIAGNOSIS — C61 Malignant neoplasm of prostate: Secondary | ICD-10-CM | POA: Diagnosis present

## 2018-08-08 DIAGNOSIS — I10 Essential (primary) hypertension: Secondary | ICD-10-CM | POA: Insufficient documentation

## 2018-08-08 DIAGNOSIS — R351 Nocturia: Secondary | ICD-10-CM | POA: Insufficient documentation

## 2018-08-08 NOTE — Consult Note (Signed)
NEW PATIENT EVALUATION  Name: Samuel Arnold  MRN: [id number]  Date:   08/08/2018     DOB: 1950/05/15   This 68 y.o. male patient presents to the clinic for initial evaluation of stage IIb (T1 cN0 M0) adenocarcinoma the prostate Gleason score of 8 (4+4) presenting with a PSA of 6.  REFERRING PHYSICIAN: [facility]  CHIEF COMPLAINT:  Chief Complaint  Patient presents with  . Prostate Cancer    Initial consultation    DIAGNOSIS: The encounter diagnosis was Prostate cancer ([place]).   PREVIOUS INVESTIGATIONS:  MRI and bone scan reviewed Pathology report reviewed Clinical notes reviewed  HPI: Patient is a 68 year old male who originally presented with an elevated PSA of 5.9 back in January 2019.  Biopsy of his mid prostate at that time showed a single focus of Gleason 6 (3+3) adenocarcinoma.  Patient elected active surveillance.  Recently MRI was recommended showing to PI-RADS category 4 lesions 1 in the right mid gland peripheral zone posterior lateral and the other in the right posterior medial peripheral zone apex.  Biopsy at that time showed 1 core of Gleason 8 (4+4) adenocarcinoma as well as 2 of the core showing Gleason 7 (4+3).  Bone scan was performed showing a single focus of asymmetric uptake in the L4-5 region.  Patient is having no back pain.  Patient is seen today for radiation oncology consultation.  He has nocturia x2 some slight urgency and frequency.  PLANNED TREATMENT REGIMEN: IMRT radiation therapy plus androgen deprivation therapy  PAST MEDICAL HISTORY:  has a past medical history of Hypercholesteremia and Hypertension.    PAST SURGICAL HISTORY:  Past Surgical History:  Procedure Laterality Date  . COLONOSCOPY WITH PROPOFOL N/A 10/27/2015   Procedure: COLONOSCOPY WITH PROPOFOL;  Surgeon: Manya Silvas, MD;  Location: [facility] ENDOSCOPY;  Service: Endoscopy;  Laterality: N/A;  . HERNIA REPAIR      FAMILY HISTORY: family history is not on file.  SOCIAL  HISTORY:  reports that he has never smoked. He has never used smokeless tobacco. He reports that he does not drink alcohol or use drugs.  ALLERGIES: Patient has no known allergies.  MEDICATIONS:  Current Outpatient Medications  Medication Sig Dispense Refill  . aspirin 81 MG tablet Take 81 mg by mouth daily.    [name] atenolol (TENORMIN) 25 MG tablet Take by mouth daily.    . chlorthalidone (HYGROTON) 25 MG tablet Take 25 mg by mouth daily.    [name] lisinopril (PRINIVIL,ZESTRIL) 10 MG tablet     . lovastatin (MEVACOR) 40 MG tablet Take 40 mg by mouth at bedtime.    . sildenafil (VIAGRA) 100 MG tablet Take 100 mg by mouth daily as needed for erectile dysfunction.     No current facility-administered medications for this encounter.     ECOG PERFORMANCE STATUS:  0 - Asymptomatic  REVIEW OF SYSTEMS: Patient denies any weight loss, fatigue, weakness, fever, chills or night sweats. Patient denies any loss of vision, blurred vision. Patient denies any ringing  of the ears or hearing loss. No irregular heartbeat. Patient denies heart murmur or history of fainting. Patient denies any chest pain or pain radiating to her upper extremities. Patient denies any shortness of breath, difficulty breathing at night, cough or hemoptysis. Patient denies any swelling in the lower legs. Patient denies any nausea vomiting, vomiting of blood, or coffee ground material in the vomitus. Patient denies any stomach pain. Patient states has had normal bowel movements no significant constipation or diarrhea.  Patient denies any dysuria, hematuria or significant nocturia. Patient denies any problems walking, swelling in the joints or loss of balance. Patient denies any skin changes, loss of hair or loss of weight. Patient denies any excessive worrying or anxiety or significant depression. Patient denies any problems with insomnia. Patient denies excessive thirst, polyuria, polydipsia. Patient denies any swollen glands, patient denies  easy bruising or easy bleeding. Patient denies any recent infections, allergies or URI. Patient "s visual fields have not changed significantly in recent time.   PHYSICAL EXAM: BP (!) 174/80 (BP Location: Left Arm, Patient Position: Sitting)   Pulse 62   Temp (!) 96.5 F (35.8 C) (Tympanic)   Resp 16   Wt 215 lb 2.7 oz (97.6 kg)   BMI 33.70 kg/m  Well-developed well-nourished patient in NAD. HEENT reveals PERLA, EOMI, discs not visualized.  Oral cavity is clear. No oral mucosal lesions are identified. Neck is clear without evidence of cervical or supraclavicular adenopathy. Lungs are clear to A&P. Cardiac examination is essentially unremarkable with regular rate and rhythm without murmur rub or thrill. Abdomen is benign with no organomegaly or masses noted. Motor sensory and DTR levels are equal and symmetric in the upper and lower extremities. Cranial nerves II through XII are grossly intact. Proprioception is intact. No peripheral adenopathy or edema is identified. No motor or sensory levels are noted. Crude visual fields are within normal range.  LABORATORY DATA: Pathology report reviewed     RADIOLOGY RESULTS: Bone scan and MRI scans reviewed   IMPRESSION: Stage IIb (T1 cN0 M0) Gleason 8 (4+4) in patient with elevated PSA in the 6 range in 68 year old male  PLAN: At this time of gone over treatment recommendations with the patient including robotic prostatectomy versus I-125 interstitial implant versus IMRT radiation therapy.  I have explained to the patient the delay in having a seed implant at this time due to the COVID-19 alteration in our's schedules and estimating approximately 4 to 5 months before we could actually do an implant on him.  Patient is more interested in external beam IMRT radiation therapy.  I have recommended a course of external beam treatment 8000 cGy using IMRT treatment planning and delivery.  Risks and benefits of treatment occluding increased lower urinary tract  symptoms possible diarrhea fatigue alteration of blood count skin reaction all were discussed in detail with the patient.  He seems to comprehend my treatment plan well.  I have personally set up and ordered CT simulation for next week.  I have also asked Dr. Dene Gentry office to give the patient a 10-month Lupron injection and that has been arranged.  Patient knows to call with any concerns at any time.  I would like to take this opportunity to thank you for allowing me to participate in the care of your patient.Noreene Filbert, MD

## 2018-08-13 ENCOUNTER — Other Ambulatory Visit: Payer: Self-pay

## 2018-08-13 ENCOUNTER — Telehealth: Payer: Self-pay | Admitting: Urology

## 2018-08-13 NOTE — Telephone Encounter (Signed)
FAXED PA REQUEST FOR LUPRON  PENDING   [name]

## 2018-08-14 ENCOUNTER — Other Ambulatory Visit: Payer: Self-pay

## 2018-08-14 ENCOUNTER — Ambulatory Visit
Admission: RE | Admit: 2018-08-14 | Discharge: 2018-08-14 | Disposition: A | Discharge status: Home / Self Care | Payer: Medicare HMO | Source: Ambulatory Visit | Attending: Radiation Oncology | Admitting: Radiation Oncology

## 2018-08-14 DIAGNOSIS — Z510 Encounter for antineoplastic radiation therapy: Secondary | ICD-10-CM | POA: Insufficient documentation

## 2018-08-14 DIAGNOSIS — Z79899 Other long term (current) drug therapy: Secondary | ICD-10-CM | POA: Insufficient documentation

## 2018-08-14 DIAGNOSIS — C61 Malignant neoplasm of prostate: Secondary | ICD-10-CM | POA: Diagnosis not present

## 2018-08-14 DIAGNOSIS — Z7982 Long term (current) use of aspirin: Secondary | ICD-10-CM | POA: Diagnosis not present

## 2018-08-15 DIAGNOSIS — Z510 Encounter for antineoplastic radiation therapy: Secondary | ICD-10-CM | POA: Diagnosis not present

## 2018-08-16 ENCOUNTER — Other Ambulatory Visit: Payer: Self-pay | Admitting: *Deleted

## 2018-08-16 DIAGNOSIS — C61 Malignant neoplasm of prostate: Secondary | ICD-10-CM

## 2018-08-19 ENCOUNTER — Telehealth: Payer: Self-pay | Admitting: Urology

## 2018-08-19 NOTE — Telephone Encounter (Signed)
LUPRON IS  NOT COVERED PER PT'S INSURANCE  WE ARE OUT OF NETWORK WITH THE PATIENT'S PLAN. AND HE DOES NOT HAVE OUT OF NETWORK BENEFITS   [name]

## 2018-08-20 ENCOUNTER — Other Ambulatory Visit: Payer: Self-pay

## 2018-08-21 ENCOUNTER — Other Ambulatory Visit: Payer: Self-pay

## 2018-08-21 ENCOUNTER — Ambulatory Visit
Admission: RE | Admit: 2018-08-21 | Discharge: 2018-08-21 | Disposition: A | Discharge status: Home / Self Care | Payer: Medicare HMO | Source: Ambulatory Visit | Attending: Radiation Oncology | Admitting: Radiation Oncology

## 2018-08-22 ENCOUNTER — Other Ambulatory Visit: Payer: Self-pay | Admitting: *Deleted

## 2018-08-22 ENCOUNTER — Other Ambulatory Visit: Payer: Self-pay

## 2018-08-22 ENCOUNTER — Ambulatory Visit
Admission: RE | Admit: 2018-08-22 | Discharge: 2018-08-22 | Disposition: A | Discharge status: Home / Self Care | Payer: Medicare HMO | Source: Ambulatory Visit | Attending: Radiation Oncology | Admitting: Radiation Oncology

## 2018-08-22 DIAGNOSIS — Z510 Encounter for antineoplastic radiation therapy: Secondary | ICD-10-CM | POA: Diagnosis not present

## 2018-08-22 DIAGNOSIS — C61 Malignant neoplasm of prostate: Secondary | ICD-10-CM

## 2018-08-23 ENCOUNTER — Ambulatory Visit
Admission: RE | Admit: 2018-08-23 | Discharge: 2018-08-23 | Disposition: A | Discharge status: Home / Self Care | Payer: Medicare HMO | Source: Ambulatory Visit | Attending: Radiation Oncology | Admitting: Radiation Oncology

## 2018-08-23 ENCOUNTER — Other Ambulatory Visit: Payer: Self-pay

## 2018-08-23 DIAGNOSIS — Z510 Encounter for antineoplastic radiation therapy: Secondary | ICD-10-CM | POA: Diagnosis not present

## 2018-08-26 ENCOUNTER — Ambulatory Visit
Admission: RE | Admit: 2018-08-26 | Discharge: 2018-08-26 | Disposition: A | Discharge status: Home / Self Care | Payer: Medicare HMO | Source: Ambulatory Visit | Attending: Radiation Oncology | Admitting: Radiation Oncology

## 2018-08-26 ENCOUNTER — Other Ambulatory Visit: Payer: Self-pay

## 2018-08-26 DIAGNOSIS — Z510 Encounter for antineoplastic radiation therapy: Secondary | ICD-10-CM | POA: Diagnosis not present

## 2018-08-27 ENCOUNTER — Other Ambulatory Visit: Payer: Self-pay

## 2018-08-27 ENCOUNTER — Ambulatory Visit
Admission: RE | Admit: 2018-08-27 | Discharge: 2018-08-27 | Disposition: A | Discharge status: Home / Self Care | Payer: Medicare HMO | Source: Ambulatory Visit | Attending: Radiation Oncology | Admitting: Radiation Oncology

## 2018-08-27 DIAGNOSIS — Z510 Encounter for antineoplastic radiation therapy: Secondary | ICD-10-CM | POA: Diagnosis not present

## 2018-08-28 ENCOUNTER — Ambulatory Visit
Admission: RE | Admit: 2018-08-28 | Discharge: 2018-08-28 | Disposition: A | Discharge status: Home / Self Care | Payer: Medicare HMO | Source: Ambulatory Visit | Attending: Radiation Oncology | Admitting: Radiation Oncology

## 2018-08-28 ENCOUNTER — Inpatient Hospital Stay: Payer: Medicare HMO | Attending: Radiation Oncology

## 2018-08-28 ENCOUNTER — Other Ambulatory Visit: Payer: Self-pay

## 2018-08-28 DIAGNOSIS — Z5111 Encounter for antineoplastic chemotherapy: Secondary | ICD-10-CM | POA: Diagnosis present

## 2018-08-28 DIAGNOSIS — C61 Malignant neoplasm of prostate: Secondary | ICD-10-CM | POA: Insufficient documentation

## 2018-08-28 DIAGNOSIS — R972 Elevated prostate specific antigen [PSA]: Secondary | ICD-10-CM | POA: Insufficient documentation

## 2018-08-28 DIAGNOSIS — Z510 Encounter for antineoplastic radiation therapy: Secondary | ICD-10-CM | POA: Diagnosis not present

## 2018-08-28 MED ORDER — LEUPROLIDE ACETATE (6 MONTH) 45 MG IM KIT
45.0000 mg | PACK | Freq: Once | INTRAMUSCULAR | Status: AC
  Administered 2018-08-28: 45 mg via INTRAMUSCULAR
  Filled 2018-08-28: qty 45

## 2018-08-29 ENCOUNTER — Other Ambulatory Visit: Payer: Self-pay

## 2018-08-29 ENCOUNTER — Ambulatory Visit
Admission: RE | Admit: 2018-08-29 | Discharge: 2018-08-29 | Disposition: A | Discharge status: Home / Self Care | Payer: Medicare HMO | Source: Ambulatory Visit | Attending: Radiation Oncology | Admitting: Radiation Oncology

## 2018-08-29 DIAGNOSIS — Z510 Encounter for antineoplastic radiation therapy: Secondary | ICD-10-CM | POA: Diagnosis not present

## 2018-08-30 ENCOUNTER — Ambulatory Visit
Admission: RE | Admit: 2018-08-30 | Discharge: 2018-08-30 | Disposition: A | Discharge status: Home / Self Care | Payer: Medicare HMO | Source: Ambulatory Visit | Attending: Radiation Oncology | Admitting: Radiation Oncology

## 2018-08-30 ENCOUNTER — Other Ambulatory Visit: Payer: Self-pay

## 2018-08-30 DIAGNOSIS — Z510 Encounter for antineoplastic radiation therapy: Secondary | ICD-10-CM | POA: Diagnosis not present

## 2018-09-03 ENCOUNTER — Ambulatory Visit: Admission: RE | Admit: 2018-09-03 | Payer: Medicare HMO | Source: Ambulatory Visit

## 2018-09-03 ENCOUNTER — Ambulatory Visit
Admission: RE | Admit: 2018-09-03 | Discharge: 2018-09-03 | Disposition: A | Discharge status: Home / Self Care | Payer: Medicare HMO | Source: Ambulatory Visit | Attending: Radiation Oncology | Admitting: Radiation Oncology

## 2018-09-03 ENCOUNTER — Other Ambulatory Visit: Payer: Self-pay

## 2018-09-03 DIAGNOSIS — Z510 Encounter for antineoplastic radiation therapy: Secondary | ICD-10-CM | POA: Diagnosis not present

## 2018-09-04 ENCOUNTER — Other Ambulatory Visit: Payer: Self-pay

## 2018-09-04 ENCOUNTER — Ambulatory Visit
Admission: RE | Admit: 2018-09-04 | Discharge: 2018-09-04 | Disposition: A | Discharge status: Home / Self Care | Payer: Medicare HMO | Source: Ambulatory Visit | Attending: Radiation Oncology | Admitting: Radiation Oncology

## 2018-09-04 DIAGNOSIS — Z510 Encounter for antineoplastic radiation therapy: Secondary | ICD-10-CM | POA: Diagnosis not present

## 2018-09-05 ENCOUNTER — Other Ambulatory Visit: Payer: Self-pay

## 2018-09-05 ENCOUNTER — Ambulatory Visit
Admission: RE | Admit: 2018-09-05 | Discharge: 2018-09-05 | Disposition: A | Discharge status: Home / Self Care | Payer: Medicare HMO | Source: Ambulatory Visit | Attending: Radiation Oncology | Admitting: Radiation Oncology

## 2018-09-05 DIAGNOSIS — Z510 Encounter for antineoplastic radiation therapy: Secondary | ICD-10-CM | POA: Diagnosis not present

## 2018-09-06 ENCOUNTER — Inpatient Hospital Stay: Payer: Medicare HMO

## 2018-09-06 ENCOUNTER — Other Ambulatory Visit: Payer: Self-pay

## 2018-09-06 ENCOUNTER — Ambulatory Visit
Admission: RE | Admit: 2018-09-06 | Discharge: 2018-09-06 | Disposition: A | Discharge status: Home / Self Care | Payer: Medicare HMO | Source: Ambulatory Visit | Attending: Radiation Oncology | Admitting: Radiation Oncology

## 2018-09-06 DIAGNOSIS — Z510 Encounter for antineoplastic radiation therapy: Secondary | ICD-10-CM | POA: Diagnosis not present

## 2018-09-06 DIAGNOSIS — C61 Malignant neoplasm of prostate: Secondary | ICD-10-CM

## 2018-09-06 DIAGNOSIS — Z5111 Encounter for antineoplastic chemotherapy: Secondary | ICD-10-CM | POA: Diagnosis not present

## 2018-09-06 LAB — CBC
HCT: 42.6 % (ref 39.0–52.0)
Hemoglobin: 13.4 g/dL (ref 13.0–17.0)
MCH: 25.4 pg — ABNORMAL LOW (ref 26.0–34.0)
MCHC: 31.5 g/dL (ref 30.0–36.0)
MCV: 80.7 fL (ref 80.0–100.0)
Platelets: 166 10*3/uL (ref 150–400)
RBC: 5.28 MIL/uL (ref 4.22–5.81)
RDW: 15.6 % — ABNORMAL HIGH (ref 11.5–15.5)
WBC: 5.2 10*3/uL (ref 4.0–10.5)
nRBC: 0 % (ref 0.0–0.2)

## 2018-09-09 ENCOUNTER — Ambulatory Visit
Admission: RE | Admit: 2018-09-09 | Discharge: 2018-09-09 | Disposition: A | Discharge status: Home / Self Care | Payer: Medicare HMO | Source: Ambulatory Visit | Attending: Radiation Oncology | Admitting: Radiation Oncology

## 2018-09-09 ENCOUNTER — Other Ambulatory Visit: Payer: Self-pay

## 2018-09-09 DIAGNOSIS — Z510 Encounter for antineoplastic radiation therapy: Secondary | ICD-10-CM | POA: Insufficient documentation

## 2018-09-09 DIAGNOSIS — Z7982 Long term (current) use of aspirin: Secondary | ICD-10-CM | POA: Insufficient documentation

## 2018-09-09 DIAGNOSIS — C61 Malignant neoplasm of prostate: Secondary | ICD-10-CM | POA: Diagnosis not present

## 2018-09-09 DIAGNOSIS — Z79899 Other long term (current) drug therapy: Secondary | ICD-10-CM | POA: Diagnosis not present

## 2018-09-10 ENCOUNTER — Other Ambulatory Visit: Payer: Self-pay

## 2018-09-10 ENCOUNTER — Ambulatory Visit
Admission: RE | Admit: 2018-09-10 | Discharge: 2018-09-10 | Disposition: A | Discharge status: Home / Self Care | Payer: Medicare HMO | Source: Ambulatory Visit | Attending: Radiation Oncology | Admitting: Radiation Oncology

## 2018-09-10 DIAGNOSIS — Z510 Encounter for antineoplastic radiation therapy: Secondary | ICD-10-CM | POA: Diagnosis not present

## 2018-09-11 ENCOUNTER — Ambulatory Visit
Admission: RE | Admit: 2018-09-11 | Discharge: 2018-09-11 | Disposition: A | Discharge status: Home / Self Care | Payer: Medicare HMO | Source: Ambulatory Visit | Attending: Radiation Oncology | Admitting: Radiation Oncology

## 2018-09-11 ENCOUNTER — Other Ambulatory Visit: Payer: Self-pay

## 2018-09-11 DIAGNOSIS — Z510 Encounter for antineoplastic radiation therapy: Secondary | ICD-10-CM | POA: Diagnosis not present

## 2018-09-12 ENCOUNTER — Ambulatory Visit
Admission: RE | Admit: 2018-09-12 | Discharge: 2018-09-12 | Disposition: A | Discharge status: Home / Self Care | Payer: Medicare HMO | Source: Ambulatory Visit | Attending: Radiation Oncology | Admitting: Radiation Oncology

## 2018-09-12 ENCOUNTER — Ambulatory Visit: Payer: Medicare HMO

## 2018-09-12 ENCOUNTER — Other Ambulatory Visit: Payer: Self-pay

## 2018-09-12 DIAGNOSIS — Z510 Encounter for antineoplastic radiation therapy: Secondary | ICD-10-CM | POA: Diagnosis not present

## 2018-09-13 ENCOUNTER — Ambulatory Visit
Admission: RE | Admit: 2018-09-13 | Discharge: 2018-09-13 | Disposition: A | Discharge status: Home / Self Care | Payer: Medicare HMO | Source: Ambulatory Visit | Attending: Radiation Oncology | Admitting: Radiation Oncology

## 2018-09-13 ENCOUNTER — Other Ambulatory Visit: Payer: Self-pay

## 2018-09-13 DIAGNOSIS — Z510 Encounter for antineoplastic radiation therapy: Secondary | ICD-10-CM | POA: Diagnosis not present

## 2018-09-16 ENCOUNTER — Other Ambulatory Visit: Payer: Self-pay

## 2018-09-16 ENCOUNTER — Ambulatory Visit
Admission: RE | Admit: 2018-09-16 | Discharge: 2018-09-16 | Disposition: A | Discharge status: Home / Self Care | Payer: Medicare HMO | Source: Ambulatory Visit | Attending: Radiation Oncology | Admitting: Radiation Oncology

## 2018-09-16 DIAGNOSIS — Z510 Encounter for antineoplastic radiation therapy: Secondary | ICD-10-CM | POA: Diagnosis not present

## 2018-09-17 ENCOUNTER — Ambulatory Visit
Admission: RE | Admit: 2018-09-17 | Discharge: 2018-09-17 | Disposition: A | Discharge status: Home / Self Care | Payer: Medicare HMO | Source: Ambulatory Visit | Attending: Radiation Oncology | Admitting: Radiation Oncology

## 2018-09-17 ENCOUNTER — Other Ambulatory Visit: Payer: Self-pay

## 2018-09-17 DIAGNOSIS — Z510 Encounter for antineoplastic radiation therapy: Secondary | ICD-10-CM | POA: Diagnosis not present

## 2018-09-18 ENCOUNTER — Ambulatory Visit
Admission: RE | Admit: 2018-09-18 | Discharge: 2018-09-18 | Disposition: A | Discharge status: Home / Self Care | Payer: Medicare HMO | Source: Ambulatory Visit | Attending: Radiation Oncology | Admitting: Radiation Oncology

## 2018-09-18 ENCOUNTER — Other Ambulatory Visit: Payer: Self-pay

## 2018-09-18 DIAGNOSIS — Z510 Encounter for antineoplastic radiation therapy: Secondary | ICD-10-CM | POA: Diagnosis not present

## 2018-09-19 ENCOUNTER — Other Ambulatory Visit: Payer: Self-pay

## 2018-09-19 ENCOUNTER — Ambulatory Visit
Admission: RE | Admit: 2018-09-19 | Discharge: 2018-09-19 | Disposition: A | Discharge status: Home / Self Care | Payer: Medicare HMO | Source: Ambulatory Visit | Attending: Radiation Oncology | Admitting: Radiation Oncology

## 2018-09-19 DIAGNOSIS — Z510 Encounter for antineoplastic radiation therapy: Secondary | ICD-10-CM | POA: Diagnosis not present

## 2018-09-20 ENCOUNTER — Other Ambulatory Visit: Payer: Self-pay

## 2018-09-20 ENCOUNTER — Ambulatory Visit
Admission: RE | Admit: 2018-09-20 | Discharge: 2018-09-20 | Disposition: A | Discharge status: Home / Self Care | Payer: Medicare HMO | Source: Ambulatory Visit | Attending: Radiation Oncology | Admitting: Radiation Oncology

## 2018-09-20 ENCOUNTER — Inpatient Hospital Stay: Payer: Medicare HMO | Attending: Radiation Oncology

## 2018-09-20 DIAGNOSIS — C61 Malignant neoplasm of prostate: Secondary | ICD-10-CM | POA: Insufficient documentation

## 2018-09-20 DIAGNOSIS — Z510 Encounter for antineoplastic radiation therapy: Secondary | ICD-10-CM | POA: Diagnosis not present

## 2018-09-20 LAB — CBC
HCT: 42.8 % (ref 39.0–52.0)
Hemoglobin: 13.5 g/dL (ref 13.0–17.0)
MCH: 25.5 pg — ABNORMAL LOW (ref 26.0–34.0)
MCHC: 31.5 g/dL (ref 30.0–36.0)
MCV: 80.8 fL (ref 80.0–100.0)
Platelets: 153 10*3/uL (ref 150–400)
RBC: 5.3 MIL/uL (ref 4.22–5.81)
RDW: 15.7 % — ABNORMAL HIGH (ref 11.5–15.5)
WBC: 6.1 10*3/uL (ref 4.0–10.5)
nRBC: 0 % (ref 0.0–0.2)

## 2018-09-22 ENCOUNTER — Ambulatory Visit: Payer: Medicare HMO

## 2018-09-23 ENCOUNTER — Ambulatory Visit
Admission: RE | Admit: 2018-09-23 | Discharge: 2018-09-23 | Disposition: A | Discharge status: Home / Self Care | Payer: Medicare HMO | Source: Ambulatory Visit | Attending: Radiation Oncology | Admitting: Radiation Oncology

## 2018-09-23 ENCOUNTER — Other Ambulatory Visit: Payer: Self-pay

## 2018-09-23 DIAGNOSIS — Z510 Encounter for antineoplastic radiation therapy: Secondary | ICD-10-CM | POA: Diagnosis not present

## 2018-09-24 ENCOUNTER — Other Ambulatory Visit: Payer: Self-pay

## 2018-09-24 ENCOUNTER — Ambulatory Visit
Admission: RE | Admit: 2018-09-24 | Discharge: 2018-09-24 | Disposition: A | Discharge status: Home / Self Care | Payer: Medicare HMO | Source: Ambulatory Visit | Attending: Radiation Oncology | Admitting: Radiation Oncology

## 2018-09-24 DIAGNOSIS — Z510 Encounter for antineoplastic radiation therapy: Secondary | ICD-10-CM | POA: Diagnosis not present

## 2018-09-25 ENCOUNTER — Ambulatory Visit
Admission: RE | Admit: 2018-09-25 | Discharge: 2018-09-25 | Disposition: A | Discharge status: Home / Self Care | Payer: Medicare HMO | Source: Ambulatory Visit | Attending: Radiation Oncology | Admitting: Radiation Oncology

## 2018-09-25 ENCOUNTER — Other Ambulatory Visit: Payer: Self-pay

## 2018-09-25 DIAGNOSIS — Z510 Encounter for antineoplastic radiation therapy: Secondary | ICD-10-CM | POA: Diagnosis not present

## 2018-09-26 ENCOUNTER — Ambulatory Visit
Admission: RE | Admit: 2018-09-26 | Discharge: 2018-09-26 | Disposition: A | Discharge status: Home / Self Care | Payer: Medicare HMO | Source: Ambulatory Visit | Attending: Radiation Oncology | Admitting: Radiation Oncology

## 2018-09-26 ENCOUNTER — Other Ambulatory Visit: Payer: Self-pay

## 2018-09-26 DIAGNOSIS — Z510 Encounter for antineoplastic radiation therapy: Secondary | ICD-10-CM | POA: Diagnosis not present

## 2018-09-27 ENCOUNTER — Other Ambulatory Visit: Payer: Self-pay

## 2018-09-27 ENCOUNTER — Ambulatory Visit
Admission: RE | Admit: 2018-09-27 | Discharge: 2018-09-27 | Disposition: A | Discharge status: Home / Self Care | Payer: Medicare HMO | Source: Ambulatory Visit | Attending: Radiation Oncology | Admitting: Radiation Oncology

## 2018-09-27 DIAGNOSIS — Z510 Encounter for antineoplastic radiation therapy: Secondary | ICD-10-CM | POA: Diagnosis not present

## 2018-09-28 ENCOUNTER — Ambulatory Visit: Payer: Medicare HMO

## 2018-09-30 ENCOUNTER — Ambulatory Visit
Admission: RE | Admit: 2018-09-30 | Discharge: 2018-09-30 | Disposition: A | Discharge status: Home / Self Care | Payer: Medicare HMO | Source: Ambulatory Visit | Attending: Radiation Oncology | Admitting: Radiation Oncology

## 2018-09-30 DIAGNOSIS — Z510 Encounter for antineoplastic radiation therapy: Secondary | ICD-10-CM | POA: Diagnosis not present

## 2018-10-01 ENCOUNTER — Other Ambulatory Visit: Payer: Self-pay

## 2018-10-01 ENCOUNTER — Ambulatory Visit
Admission: RE | Admit: 2018-10-01 | Discharge: 2018-10-01 | Disposition: A | Discharge status: Home / Self Care | Payer: Medicare HMO | Source: Ambulatory Visit | Attending: Radiation Oncology | Admitting: Radiation Oncology

## 2018-10-01 DIAGNOSIS — Z510 Encounter for antineoplastic radiation therapy: Secondary | ICD-10-CM | POA: Diagnosis not present

## 2018-10-02 ENCOUNTER — Other Ambulatory Visit: Payer: Self-pay

## 2018-10-02 ENCOUNTER — Ambulatory Visit
Admission: RE | Admit: 2018-10-02 | Discharge: 2018-10-02 | Disposition: A | Discharge status: Home / Self Care | Payer: Medicare HMO | Source: Ambulatory Visit | Attending: Radiation Oncology | Admitting: Radiation Oncology

## 2018-10-02 DIAGNOSIS — Z510 Encounter for antineoplastic radiation therapy: Secondary | ICD-10-CM | POA: Diagnosis not present

## 2018-10-03 ENCOUNTER — Other Ambulatory Visit: Payer: Self-pay

## 2018-10-03 ENCOUNTER — Ambulatory Visit
Admission: RE | Admit: 2018-10-03 | Discharge: 2018-10-03 | Disposition: A | Discharge status: Home / Self Care | Payer: Medicare HMO | Source: Ambulatory Visit | Attending: Radiation Oncology | Admitting: Radiation Oncology

## 2018-10-03 DIAGNOSIS — Z510 Encounter for antineoplastic radiation therapy: Secondary | ICD-10-CM | POA: Diagnosis not present

## 2018-10-04 ENCOUNTER — Inpatient Hospital Stay: Payer: Medicare HMO

## 2018-10-04 ENCOUNTER — Other Ambulatory Visit: Payer: Self-pay

## 2018-10-04 ENCOUNTER — Ambulatory Visit
Admission: RE | Admit: 2018-10-04 | Discharge: 2018-10-04 | Disposition: A | Discharge status: Home / Self Care | Payer: Medicare HMO | Source: Ambulatory Visit | Attending: Radiation Oncology | Admitting: Radiation Oncology

## 2018-10-04 DIAGNOSIS — Z510 Encounter for antineoplastic radiation therapy: Secondary | ICD-10-CM | POA: Diagnosis not present

## 2018-10-04 DIAGNOSIS — C61 Malignant neoplasm of prostate: Secondary | ICD-10-CM | POA: Diagnosis not present

## 2018-10-04 LAB — CBC
HCT: 40.4 % (ref 39.0–52.0)
Hemoglobin: 12.9 g/dL — ABNORMAL LOW (ref 13.0–17.0)
MCH: 25.5 pg — ABNORMAL LOW (ref 26.0–34.0)
MCHC: 31.9 g/dL (ref 30.0–36.0)
MCV: 80 fL (ref 80.0–100.0)
Platelets: 166 10*3/uL (ref 150–400)
RBC: 5.05 MIL/uL (ref 4.22–5.81)
RDW: 15.4 % (ref 11.5–15.5)
WBC: 4.4 10*3/uL (ref 4.0–10.5)
nRBC: 0 % (ref 0.0–0.2)

## 2018-10-05 ENCOUNTER — Ambulatory Visit: Payer: Medicare HMO

## 2018-10-07 ENCOUNTER — Ambulatory Visit
Admission: RE | Admit: 2018-10-07 | Discharge: 2018-10-07 | Disposition: A | Discharge status: Home / Self Care | Payer: Medicare HMO | Source: Ambulatory Visit | Attending: Radiation Oncology | Admitting: Radiation Oncology

## 2018-10-07 ENCOUNTER — Other Ambulatory Visit: Payer: Self-pay

## 2018-10-07 DIAGNOSIS — Z510 Encounter for antineoplastic radiation therapy: Secondary | ICD-10-CM | POA: Diagnosis not present

## 2018-10-08 ENCOUNTER — Ambulatory Visit
Admission: RE | Admit: 2018-10-08 | Discharge: 2018-10-08 | Disposition: A | Discharge status: Home / Self Care | Payer: Medicare HMO | Source: Ambulatory Visit | Attending: Radiation Oncology | Admitting: Radiation Oncology

## 2018-10-08 DIAGNOSIS — Z510 Encounter for antineoplastic radiation therapy: Secondary | ICD-10-CM | POA: Diagnosis not present

## 2018-10-09 ENCOUNTER — Other Ambulatory Visit: Payer: Self-pay

## 2018-10-09 ENCOUNTER — Ambulatory Visit
Admission: RE | Admit: 2018-10-09 | Discharge: 2018-10-09 | Disposition: A | Discharge status: Home / Self Care | Payer: Medicare HMO | Source: Ambulatory Visit | Attending: Radiation Oncology | Admitting: Radiation Oncology

## 2018-10-09 DIAGNOSIS — C61 Malignant neoplasm of prostate: Secondary | ICD-10-CM | POA: Diagnosis not present

## 2018-10-09 DIAGNOSIS — Z79899 Other long term (current) drug therapy: Secondary | ICD-10-CM | POA: Diagnosis not present

## 2018-10-09 DIAGNOSIS — Z7982 Long term (current) use of aspirin: Secondary | ICD-10-CM | POA: Insufficient documentation

## 2018-10-09 DIAGNOSIS — Z510 Encounter for antineoplastic radiation therapy: Secondary | ICD-10-CM | POA: Insufficient documentation

## 2018-10-10 ENCOUNTER — Ambulatory Visit: Payer: Medicare HMO

## 2018-10-10 ENCOUNTER — Other Ambulatory Visit: Payer: Self-pay

## 2018-10-10 ENCOUNTER — Ambulatory Visit
Admission: RE | Admit: 2018-10-10 | Discharge: 2018-10-10 | Disposition: A | Discharge status: Home / Self Care | Payer: Medicare HMO | Source: Ambulatory Visit | Attending: Radiation Oncology | Admitting: Radiation Oncology

## 2018-10-10 DIAGNOSIS — Z510 Encounter for antineoplastic radiation therapy: Secondary | ICD-10-CM | POA: Diagnosis not present

## 2018-10-12 ENCOUNTER — Ambulatory Visit: Payer: Medicare HMO

## 2018-10-14 ENCOUNTER — Other Ambulatory Visit: Payer: Self-pay

## 2018-10-14 ENCOUNTER — Ambulatory Visit
Admission: RE | Admit: 2018-10-14 | Discharge: 2018-10-14 | Disposition: A | Discharge status: Home / Self Care | Payer: Medicare HMO | Source: Ambulatory Visit | Attending: Radiation Oncology | Admitting: Radiation Oncology

## 2018-10-14 DIAGNOSIS — Z510 Encounter for antineoplastic radiation therapy: Secondary | ICD-10-CM | POA: Diagnosis not present

## 2018-10-15 ENCOUNTER — Other Ambulatory Visit: Payer: Self-pay

## 2018-10-15 ENCOUNTER — Ambulatory Visit
Admission: RE | Admit: 2018-10-15 | Discharge: 2018-10-15 | Disposition: A | Discharge status: Home / Self Care | Payer: Medicare HMO | Source: Ambulatory Visit | Attending: Radiation Oncology | Admitting: Radiation Oncology

## 2018-10-15 DIAGNOSIS — Z510 Encounter for antineoplastic radiation therapy: Secondary | ICD-10-CM | POA: Diagnosis not present

## 2018-10-16 ENCOUNTER — Other Ambulatory Visit: Payer: Self-pay

## 2018-10-16 ENCOUNTER — Ambulatory Visit
Admission: RE | Admit: 2018-10-16 | Discharge: 2018-10-16 | Disposition: A | Discharge status: Home / Self Care | Payer: Medicare HMO | Source: Ambulatory Visit | Attending: Radiation Oncology | Admitting: Radiation Oncology

## 2018-10-16 DIAGNOSIS — Z510 Encounter for antineoplastic radiation therapy: Secondary | ICD-10-CM | POA: Diagnosis not present

## 2018-10-17 ENCOUNTER — Ambulatory Visit
Admission: RE | Admit: 2018-10-17 | Discharge: 2018-10-17 | Disposition: A | Discharge status: Home / Self Care | Payer: Medicare HMO | Source: Ambulatory Visit | Attending: Radiation Oncology | Admitting: Radiation Oncology

## 2018-10-17 ENCOUNTER — Other Ambulatory Visit: Payer: Self-pay

## 2018-10-17 DIAGNOSIS — Z510 Encounter for antineoplastic radiation therapy: Secondary | ICD-10-CM | POA: Diagnosis not present

## 2018-10-18 ENCOUNTER — Ambulatory Visit
Admission: RE | Admit: 2018-10-18 | Discharge: 2018-10-18 | Disposition: A | Discharge status: Home / Self Care | Payer: Medicare HMO | Source: Ambulatory Visit | Attending: Radiation Oncology | Admitting: Radiation Oncology

## 2018-10-18 ENCOUNTER — Other Ambulatory Visit: Payer: Self-pay

## 2018-10-18 ENCOUNTER — Ambulatory Visit: Payer: Medicare HMO

## 2018-10-18 DIAGNOSIS — Z510 Encounter for antineoplastic radiation therapy: Secondary | ICD-10-CM | POA: Diagnosis not present

## 2018-10-21 ENCOUNTER — Ambulatory Visit: Payer: Medicare HMO

## 2018-11-21 ENCOUNTER — Other Ambulatory Visit: Payer: Self-pay

## 2018-11-22 ENCOUNTER — Other Ambulatory Visit: Payer: Self-pay

## 2018-11-22 ENCOUNTER — Other Ambulatory Visit: Payer: Self-pay | Admitting: *Deleted

## 2018-11-22 ENCOUNTER — Ambulatory Visit
Admission: RE | Admit: 2018-11-22 | Discharge: 2018-11-22 | Disposition: A | Discharge status: Home / Self Care | Payer: Medicare HMO | Source: Ambulatory Visit | Attending: Radiation Oncology | Admitting: Radiation Oncology

## 2018-11-22 ENCOUNTER — Encounter: Payer: Self-pay | Admitting: Radiation Oncology

## 2018-11-22 VITALS — BP 177/67 | HR 66 | Temp 97.4°F | Resp 16 | Wt 221.4 lb

## 2018-11-22 DIAGNOSIS — Z923 Personal history of irradiation: Secondary | ICD-10-CM | POA: Diagnosis not present

## 2018-11-22 DIAGNOSIS — C61 Malignant neoplasm of prostate: Secondary | ICD-10-CM | POA: Diagnosis present

## 2018-11-22 DIAGNOSIS — R3915 Urgency of urination: Secondary | ICD-10-CM | POA: Diagnosis not present

## 2018-11-22 DIAGNOSIS — R351 Nocturia: Secondary | ICD-10-CM | POA: Insufficient documentation

## 2018-11-22 DIAGNOSIS — R350 Frequency of micturition: Secondary | ICD-10-CM | POA: Insufficient documentation

## 2018-11-22 NOTE — Progress Notes (Signed)
Radiation Oncology Follow up Note  Name: Samuel Arnold   Date:   11/22/2018 MRN:  [id number] DOB: [birth year]    This 68 y.o. male presents to the clinic today for 1 month follow-up status post external beam radiation therapy to both his prostate and pelvic nodes for Gleason 8 (4+4) presenting with a PSA of 6.  REFERRING PROVIDER: Joyice Faster, FNP  HPI: Patient is a 68 year old male.  Now out 1 month having completed external beam radiation therapy for Gleason 8 (4+4) adenocarcinoma prostate presenting with a PSA in the 6 range seen today in routine follow-up he does have some nocturia x3 and some frequency and urgency.  He specifically denies diarrhea.  He is not on any medication at this time for his frequency.  COMPLICATIONS OF TREATMENT: none  FOLLOW UP COMPLIANCE: keeps appointments   PHYSICAL EXAM:  BP (!) 177/67   Pulse 66   Temp (!) 97.4 F (36.3 C) (Tympanic)   Resp 16   Wt 221 lb 6.4 oz (100.4 kg)   BMI 34.68 kg/m  Well-developed well-nourished patient in NAD. HEENT reveals PERLA, EOMI, discs not visualized.  Oral cavity is clear. No oral mucosal lesions are identified. Neck is clear without evidence of cervical or supraclavicular adenopathy. Lungs are clear to A&P. Cardiac examination is essentially unremarkable with regular rate and rhythm without murmur rub or thrill. Abdomen is benign with no organomegaly or masses noted. Motor sensory and DTR levels are equal and symmetric in the upper and lower extremities. Cranial nerves II through XII are grossly intact. Proprioception is intact. No peripheral adenopathy or edema is identified. No motor or sensory levels are noted. Crude visual fields are within normal range.  RADIOLOGY RESULTS: No current films for review  PLAN: Present time I have suggested cranberry juice hesitant to prescribe Flomax since he is only having nocturia x3.  I have explained to him over the next several months this will improve.  I have asked to  see him back in 3 months for follow-up with a PSA prior to that appointment.  Patient knows to call should his symptoms worsen.  I would like to take this opportunity to thank you for allowing me to participate in the care of your patient.Noreene Filbert, MD

## 2019-02-03 ENCOUNTER — Other Ambulatory Visit: Payer: Self-pay

## 2019-02-03 ENCOUNTER — Inpatient Hospital Stay: Payer: Medicare HMO | Attending: Radiation Oncology

## 2019-02-03 DIAGNOSIS — C61 Malignant neoplasm of prostate: Secondary | ICD-10-CM | POA: Diagnosis not present

## 2019-02-03 LAB — PSA: Prostatic Specific Antigen: 0.04 ng/mL (ref 0.00–4.00)

## 2019-02-10 ENCOUNTER — Ambulatory Visit
Admission: RE | Admit: 2019-02-10 | Discharge: 2019-02-10 | Disposition: A | Discharge status: Home / Self Care | Payer: Medicare HMO | Source: Ambulatory Visit | Attending: Radiation Oncology | Admitting: Radiation Oncology

## 2019-02-10 ENCOUNTER — Encounter: Payer: Self-pay | Admitting: Radiation Oncology

## 2019-02-10 ENCOUNTER — Other Ambulatory Visit: Payer: Self-pay

## 2019-02-10 VITALS — BP 179/68 | HR 56 | Temp 98.3°F | Wt 217.9 lb

## 2019-02-10 DIAGNOSIS — C774 Secondary and unspecified malignant neoplasm of inguinal and lower limb lymph nodes: Secondary | ICD-10-CM | POA: Insufficient documentation

## 2019-02-10 DIAGNOSIS — C61 Malignant neoplasm of prostate: Secondary | ICD-10-CM | POA: Insufficient documentation

## 2019-02-10 DIAGNOSIS — R351 Nocturia: Secondary | ICD-10-CM | POA: Insufficient documentation

## 2019-02-10 DIAGNOSIS — Z923 Personal history of irradiation: Secondary | ICD-10-CM | POA: Diagnosis not present

## 2019-02-10 NOTE — Progress Notes (Signed)
Radiation Oncology Follow up Note  Name: Samuel Arnold   Date:   02/10/2019 MRN:  [id number] DOB: 06-16-1950    This 68 y.o. 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  Patient has stage IIb disease  REFERRING PROVIDER: Joyice Faster, FNP  HPI: Patient is a 68 year old male now out 4 months having completed IMRT radiation therapy to his prostate and pelvic nodes for Gleason 8 (4+4) adenocarcinoma the prostate presenting with a PSA in the 6 range.  He is seen today in routine follow-up is doing well still has nocturia x3 which is fairly normal for the patient no specific diarrhea or dysuria.  His most recent PSA is 0.04.  He is currently not on androgen deprivation therapy.  Based on his insurance coverage being out of network  COMPLICATIONS OF TREATMENT: none  FOLLOW UP COMPLIANCE: keeps appointments   PHYSICAL EXAM:  BP (!) 179/68   Pulse (!) 56   Temp 98.3 F (36.8 C) (Tympanic)   Wt 217 lb 14.4 oz (98.8 kg)   BMI 34.13 kg/m  Well-developed well-nourished patient in NAD. HEENT reveals PERLA, EOMI, discs not visualized.  Oral cavity is clear. No oral mucosal lesions are identified. Neck is clear without evidence of cervical or supraclavicular adenopathy. Lungs are clear to A&P. Cardiac examination is essentially unremarkable with regular rate and rhythm without murmur rub or thrill. Abdomen is benign with no organomegaly or masses noted. Motor sensory and DTR levels are equal and symmetric in the upper and lower extremities. Cranial nerves II through XII are grossly intact. Proprioception is intact. No peripheral adenopathy or edema is identified. No motor or sensory levels are noted. Crude visual fields are within normal range.  RADIOLOGY RESULTS: No current films for review  PLAN: Present time patient is doing  well under excellent biochemical control of his prostate cancer.  He has a very low side effect profile.  I am pleased with his overall progress.  We will continue to try to establish  androgen deprivation therapy under the direction of urology.  I have asked to see him back in 6 months for follow-up with a PSA prior to that visit.  Patient knows to call with any concerns.  I would like to take this opportunity to thank you for allowing me to participate in the care of your patient.Noreene Filbert, MD

## 2019-04-30 ENCOUNTER — Encounter: Payer: Self-pay | Admitting: *Deleted

## 2019-08-04 ENCOUNTER — Other Ambulatory Visit: Payer: Self-pay

## 2019-08-04 ENCOUNTER — Inpatient Hospital Stay: Payer: Medicare HMO | Attending: Radiation Oncology

## 2019-08-04 DIAGNOSIS — C61 Malignant neoplasm of prostate: Secondary | ICD-10-CM

## 2019-08-04 LAB — PSA: Prostatic Specific Antigen: 0.17 ng/mL (ref 0.00–4.00)

## 2019-08-07 ENCOUNTER — Encounter: Payer: Self-pay | Admitting: Radiation Oncology

## 2019-08-07 ENCOUNTER — Ambulatory Visit
Admission: RE | Admit: 2019-08-07 | Discharge: 2019-08-07 | Disposition: A | Discharge status: Home / Self Care | Payer: Medicare HMO | Source: Ambulatory Visit | Attending: Radiation Oncology | Admitting: Radiation Oncology

## 2019-08-07 ENCOUNTER — Other Ambulatory Visit: Payer: Self-pay

## 2019-08-07 VITALS — BP 171/75 | HR 65 | Temp 96.4°F | Resp 16 | Wt 222.2 lb

## 2019-08-07 DIAGNOSIS — C779 Secondary and unspecified malignant neoplasm of lymph node, unspecified: Secondary | ICD-10-CM | POA: Diagnosis not present

## 2019-08-07 DIAGNOSIS — Z923 Personal history of irradiation: Secondary | ICD-10-CM | POA: Insufficient documentation

## 2019-08-07 DIAGNOSIS — C61 Malignant neoplasm of prostate: Secondary | ICD-10-CM | POA: Insufficient documentation

## 2019-08-07 NOTE — Progress Notes (Signed)
Radiation Oncology Follow up Note  Name: Samuel Arnold   Date:   08/07/2019 MRN:  [id number] DOB: [birth year]    This 69 y.o. 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  REFERRING PROVIDER: Joyice Faster, FNP  HPI: Patient is a 69 year old male now at 10 months having completed IMRT radiation therapy to his prostate and pelvic nodes for Gleason 8 adenocarcinoma presenting with a PSA in the 6 range.  He is seen today in routine follow-up and is doing well.  He specifically denies any increased lower urinary tract symptoms diarrhea or fatigue.[name]  His recent PSA is 0.17 up slightly from 0.046 months ago.  COMPLICATIONS OF TREATMENT: none  FOLLOW UP COMPLIANCE: keeps appointments   PHYSICAL EXAM:  BP (!) 171/75 (BP Location: Left Arm, Patient Position: Sitting, Cuff Size: Large)   Pulse 65   Temp (!) 96.4 F (35.8 C) (Tympanic)   Resp 16   Wt 222 lb 3.2 oz (100.8 kg)   BMI 34.80 kg/m  Well-developed well-nourished patient in NAD. HEENT reveals PERLA, EOMI, discs not visualized.  Oral cavity is clear. No oral mucosal lesions are identified. Neck is clear without evidence of cervical or supraclavicular adenopathy. Lungs are clear to A&P. Cardiac examination is essentially unremarkable with regular rate and rhythm without murmur rub or thrill. Abdomen is benign with no organomegaly or masses noted. Motor sensory and DTR levels are equal and symmetric in the upper and lower extremities. Cranial nerves II through XII are grossly intact. Proprioception is intact. No peripheral adenopathy or edema is identified. No motor or sensory levels are noted. Crude visual fields are within normal range.  RADIOLOGY RESULTS: No current films for review  PLAN: Present time patient is doing well under good biochemical control of his prostate cancer.  I am pleased with his  overall progress he has a slight uptake in his PSA still 0.17.  We will see him back in 6 months with repeat PSA at that time he is currently not on androgen deprivation therapy.  Patient knows to call with any concerns.  I would like to take this opportunity to thank you for allowing me to participate in the care of your patient.Noreene Filbert, MD

## 2019-08-11 ENCOUNTER — Ambulatory Visit: Payer: Medicare HMO | Admitting: Radiation Oncology

## 2020-01-30 ENCOUNTER — Other Ambulatory Visit: Payer: Self-pay

## 2020-01-30 ENCOUNTER — Inpatient Hospital Stay: Payer: Medicare HMO | Attending: Radiation Oncology

## 2020-01-30 DIAGNOSIS — C61 Malignant neoplasm of prostate: Secondary | ICD-10-CM

## 2020-01-30 LAB — PSA: Prostatic Specific Antigen: 0.08 ng/mL (ref 0.00–4.00)

## 2020-02-06 ENCOUNTER — Ambulatory Visit: Payer: Medicare HMO | Admitting: Radiation Oncology

## 2020-02-13 ENCOUNTER — Ambulatory Visit
Admission: RE | Admit: 2020-02-13 | Discharge: 2020-02-13 | Disposition: A | Discharge status: Home / Self Care | Payer: Medicare HMO | Source: Ambulatory Visit | Attending: Radiation Oncology | Admitting: Radiation Oncology

## 2020-02-13 ENCOUNTER — Encounter: Payer: Self-pay | Admitting: Radiation Oncology

## 2020-02-13 ENCOUNTER — Other Ambulatory Visit: Payer: Self-pay | Admitting: *Deleted

## 2020-02-13 VITALS — BP 145/69 | HR 62 | Wt 212.0 lb

## 2020-02-13 DIAGNOSIS — Z923 Personal history of irradiation: Secondary | ICD-10-CM | POA: Insufficient documentation

## 2020-02-13 DIAGNOSIS — C61 Malignant neoplasm of prostate: Secondary | ICD-10-CM

## 2020-02-13 NOTE — Progress Notes (Signed)
Radiation Oncology Follow up Note  Name: Samuel Arnold   Date:   02/13/2020 MRN:  [id number] DOB: 1951/03/30    This 69 y.o. male presents to the clinic today for 30-month follow-up status post IMRT radiation therapy to both his prostate and pelvic nodes for Gleason 8 (4+4) adenocarcinoma prostate.  REFERRING PROVIDER: Joyice Faster, FNP  HPI: Patient is a 68 year old male now out 16 months having pleated IMRT radiation therapy to his prostate and pelvic nodes for Gleason 8 adenocarcinoma presenting with a PSA in the 6 range.  Seen today in routine follow-up he is doing well.[name]  He is not receiving Lupron based on insurance coverage through the urologist office.  Despite that his PSA continues to decline is most recently 0.08.  He specifically denies increased urinary frequency urgency diarrhea or fatigue.  COMPLICATIONS OF TREATMENT: none  FOLLOW UP COMPLIANCE: keeps appointments   PHYSICAL EXAM:  BP (!) 145/69 (BP Location: Left Arm, Patient Position: Sitting)    Pulse 62    Wt 212 lb (96.2 kg)    BMI 33.20 kg/m  Well-developed well-nourished patient in NAD. HEENT reveals PERLA, EOMI, discs not visualized.  Oral cavity is clear. No oral mucosal lesions are identified. Neck is clear without evidence of cervical or supraclavicular adenopathy. Lungs are clear to A&P. Cardiac examination is essentially unremarkable with regular rate and rhythm without murmur rub or thrill. Abdomen is benign with no organomegaly or masses noted. Motor sensory and DTR levels are equal and symmetric in the upper and lower extremities. Cranial nerves II through XII are grossly intact. Proprioception is intact. No peripheral adenopathy or edema is identified. No motor or sensory levels are noted. Crude visual fields are within normal range.  RADIOLOGY RESULTS: No current films for review  PLAN: Present time patient is doing well with excellent biochemical control of his prostate cancer.  I will see him back in 1  year for follow-up with a PSA at that time.  Patient knows to call with any concerns.  I would like to take this opportunity to thank you for allowing me to participate in the care of your patient.Noreene Filbert, MD

## 2021-02-03 ENCOUNTER — Other Ambulatory Visit: Payer: Self-pay

## 2021-02-03 ENCOUNTER — Inpatient Hospital Stay: Payer: Medicare HMO | Attending: Radiation Oncology

## 2021-02-03 DIAGNOSIS — C61 Malignant neoplasm of prostate: Secondary | ICD-10-CM | POA: Diagnosis not present

## 2021-02-03 LAB — PSA: Prostatic Specific Antigen: 0.46 ng/mL (ref 0.00–4.00)

## 2021-02-10 ENCOUNTER — Other Ambulatory Visit: Payer: Self-pay

## 2021-02-10 ENCOUNTER — Ambulatory Visit
Admission: RE | Admit: 2021-02-10 | Discharge: 2021-02-10 | Disposition: A | Discharge status: Home / Self Care | Payer: Medicare HMO | Source: Ambulatory Visit | Attending: Radiation Oncology | Admitting: Radiation Oncology

## 2021-02-10 ENCOUNTER — Encounter: Payer: Self-pay | Admitting: Radiation Oncology

## 2021-02-10 VITALS — BP 154/86 | HR 64 | Temp 97.2°F | Resp 18 | Wt 219.4 lb

## 2021-02-10 DIAGNOSIS — C775 Secondary and unspecified malignant neoplasm of intrapelvic lymph nodes: Secondary | ICD-10-CM | POA: Insufficient documentation

## 2021-02-10 DIAGNOSIS — C61 Malignant neoplasm of prostate: Secondary | ICD-10-CM | POA: Diagnosis not present

## 2021-02-10 DIAGNOSIS — Z923 Personal history of irradiation: Secondary | ICD-10-CM | POA: Diagnosis not present

## 2021-02-10 NOTE — Progress Notes (Signed)
Radiation Oncology Follow up Note  Name: Samuel Arnold   Date:   02/10/2021 MRN:  [id number] DOB: 10/01/1950    This 70 y.o. male presents to the clinic today for 2 and half year follow-up status post IMRT radiation therapy to his prostate for Gleason 8 (4+4 adenocarcinoma presenting with a PSA in the 6 range.  REFERRING PROVIDER: Joyice Faster, FNP  HPI: Patient is a 70 year old male now 2 and half years having completed IMRT radiation therapy to both his prostate and pelvic nodes for Gleason 8 adenocarcinoma presenting with a PSA of 6 seen today in routine follow-up he is doing well specifically denies any increased lower urinary tract symptoms diarrhea or fatigue.[name]  His recent PSA has picked up slightly 2.46 from 0.081-year prior.  Patient is currently not on ADT therapy  COMPLICATIONS OF TREATMENT: none  FOLLOW UP COMPLIANCE: keeps appointments   PHYSICAL EXAM:  BP (!) 154/86 (BP Location: Right Arm, Patient Position: Sitting, Cuff Size: Normal)   Pulse 64   Temp (!) 97.2 F (36.2 C) (Tympanic)   Resp 18   Wt 219 lb 6.4 oz (99.5 kg)   SpO2 97%   BMI 34.36 kg/m  Well-developed well-nourished patient in NAD. HEENT reveals PERLA, EOMI, discs not visualized.  Oral cavity is clear. No oral mucosal lesions are identified. Neck is clear without evidence of cervical or supraclavicular adenopathy. Lungs are clear to A&P. Cardiac examination is essentially unremarkable with regular rate and rhythm without murmur rub or thrill. Abdomen is benign with no organomegaly or masses noted. Motor sensory and DTR levels are equal and symmetric in the upper and lower extremities. Cranial nerves II through XII are grossly intact. Proprioception is intact. No peripheral adenopathy or edema is identified. No motor or sensory levels are noted. Crude visual fields are within normal range.  RADIOLOGY RESULTS: No current films for review  PLAN: Present time patient is clinically doing well.  Slight  uptake in his PSA.  I Georgina Peer close his window a follow-up to 6 months to repeat his PSA should he continue to rise would refer back to urology to start the patient on ADT therapy and possible medical oncology visit.  Patient comprehends my recommendations well.  I would like to take this opportunity to thank you for allowing me to participate in the care of your patient.Noreene Filbert, MD

## 2021-08-04 ENCOUNTER — Other Ambulatory Visit: Payer: Medicare HMO

## 2021-08-04 ENCOUNTER — Inpatient Hospital Stay: Payer: Medicare HMO | Attending: Radiation Oncology

## 2021-08-04 DIAGNOSIS — C61 Malignant neoplasm of prostate: Secondary | ICD-10-CM | POA: Insufficient documentation

## 2021-08-04 LAB — PSA: Prostatic Specific Antigen: 0.17 ng/mL (ref 0.00–4.00)

## 2021-08-11 ENCOUNTER — Ambulatory Visit
Admission: RE | Admit: 2021-08-11 | Discharge: 2021-08-11 | Disposition: A | Discharge status: Home / Self Care | Payer: Medicare HMO | Source: Ambulatory Visit | Attending: Radiation Oncology | Admitting: Radiation Oncology

## 2021-08-11 VITALS — BP 163/66 | HR 63 | Temp 97.4°F | Wt 224.1 lb

## 2021-08-11 DIAGNOSIS — C61 Malignant neoplasm of prostate: Secondary | ICD-10-CM | POA: Diagnosis not present

## 2021-08-11 DIAGNOSIS — Z923 Personal history of irradiation: Secondary | ICD-10-CM | POA: Diagnosis not present

## 2021-08-11 NOTE — Progress Notes (Signed)
Radiation Oncology ?Follow up Note ? ?Name: Samuel Arnold   ?Date:   08/11/2021 ?MRN:  [id number] ?DOB: 01-23-1951  ? ? ?This 71 y.o. male presents to the clinic today for 3-year follow-up status post IMRT radiation therapy to his prostate for Gleason 8 (4+4) and stenting with a PSA in the 6 range.. ? ?REFERRING PROVIDER: Joyice Faster, FNP ? ?HPI: Patient is a 71 year old male now out over 3 years having completed IMRT radiation therapy to his prostate and pelvic nodes for Gleason 8 adenocarcinoma.  Seen today in routine follow-up he is doing well.  He specifically denies any increased lower urinary tract symptoms diarrhea or fatigue.[name]  His most recent PSA is 0.17 down from 0.466 months ago. ? ?COMPLICATIONS OF TREATMENT: none ? ?FOLLOW UP COMPLIANCE: keeps appointments  ? ?PHYSICAL EXAM:  ?BP (!) 163/66   Pulse 63   Temp (!) 97.4 ?F (36.3 ?C)   Wt 224 lb 1.6 oz (101.7 kg)   BMI 35.10 kg/m?  ?Well-developed well-nourished patient in NAD. HEENT reveals PERLA, EOMI, discs not visualized.  Oral cavity is clear. No oral mucosal lesions are identified. Neck is clear without evidence of cervical or supraclavicular adenopathy. Lungs are clear to A&P. Cardiac examination is essentially unremarkable with regular rate and rhythm without murmur rub or thrill. Abdomen is benign with no organomegaly or masses noted. Motor sensory and DTR levels are equal and symmetric in the upper and lower extremities. Cranial nerves II through XII are grossly intact. Proprioception is intact. No peripheral adenopathy or edema is identified. No motor or sensory levels are noted. Crude visual fields are within normal range. ? ?RADIOLOGY RESULTS: No current films for review ? ?PLAN: Present time patient is doing well with no evidence of disease under excellent biochemical control of his prostate cancer.  And pleased with his overall progress.  I have asked to see him back in 1 year for follow-up.  Patient is to call with any concerns. ? ?I  would like to take this opportunity to thank you for allowing me to participate in the care of your patient.. ?  ? Noreene Filbert, MD ? ?

## 2021-09-08 ENCOUNTER — Ambulatory Visit
Admission: RE | Admit: 2021-09-08 | Discharge: 2021-09-08 | Disposition: A | Discharge status: Home / Self Care | Payer: Medicare HMO | Source: Ambulatory Visit | Attending: Emergency Medicine | Admitting: Emergency Medicine

## 2021-09-08 ENCOUNTER — Other Ambulatory Visit: Payer: Self-pay | Admitting: Emergency Medicine

## 2021-09-08 DIAGNOSIS — M7989 Other specified soft tissue disorders: Secondary | ICD-10-CM | POA: Diagnosis not present

## 2022-07-28 IMAGING — US US EXTREM LOW VENOUS*L*
1 series · 14 of 24 positions shown · non-contrast
Comparison: None Available.

CLINICAL DATA: Pain and swelling

EXAM:
Left LOWER EXTREMITY VENOUS DOPPLER ULTRASOUND
TECHNIQUE: Gray-scale sonography with compression, as well as color and duplex
ultrasound, were performed to evaluate the deep venous system(s)
from the level of the common femoral vein through the popliteal and
proximal calf veins.

[Series 1: us venous img lower uni left (dvt) · portal-venous · 14 of 33 slices shown]
[im 1/33]
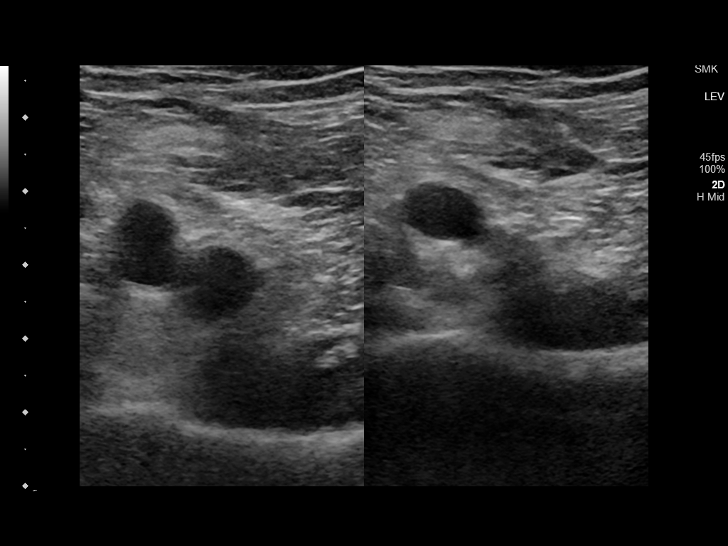
[im 3/33]
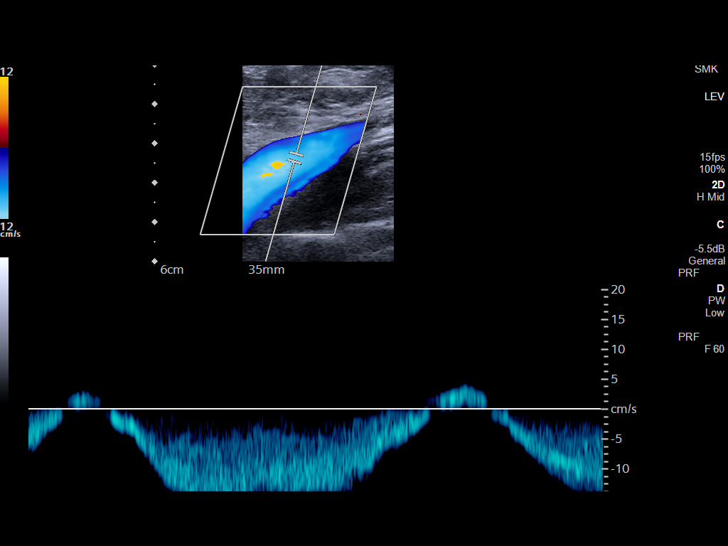
[im 6/33]
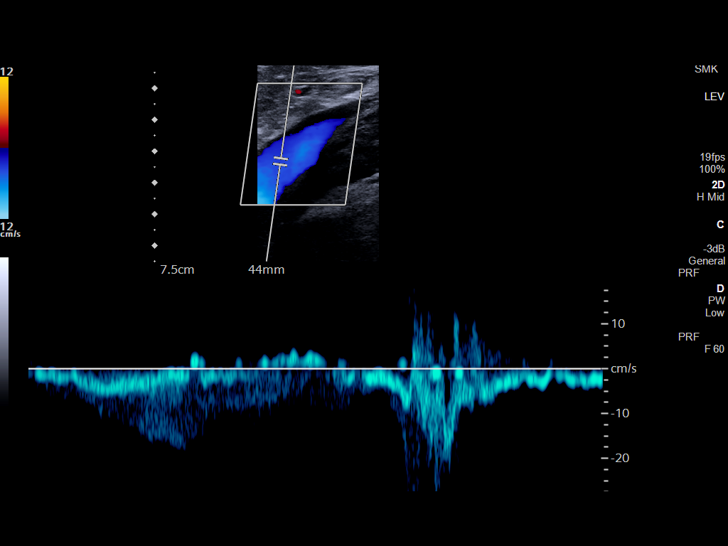
[im 9/33]
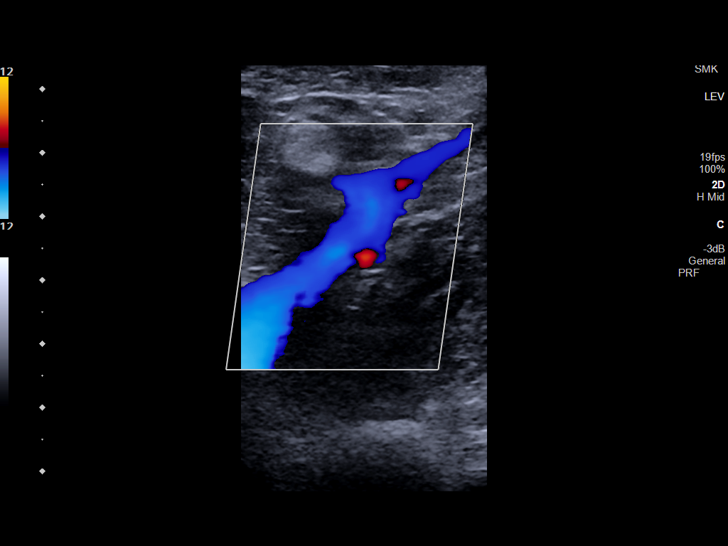
[im 10/33]
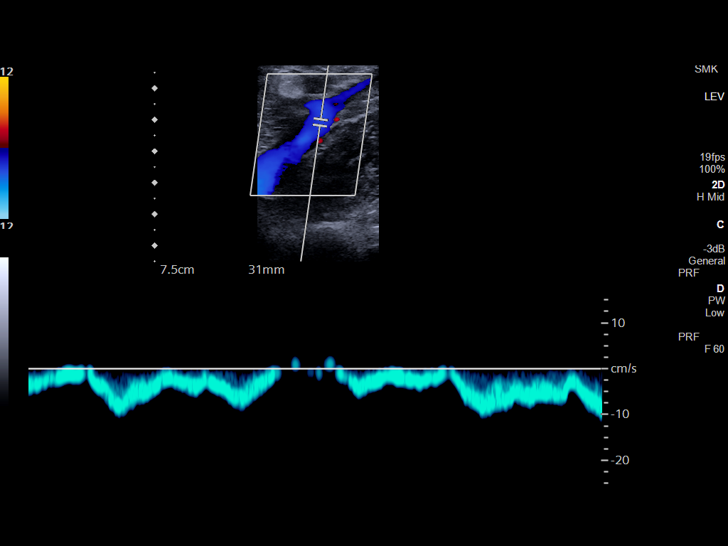
[im 13/33]
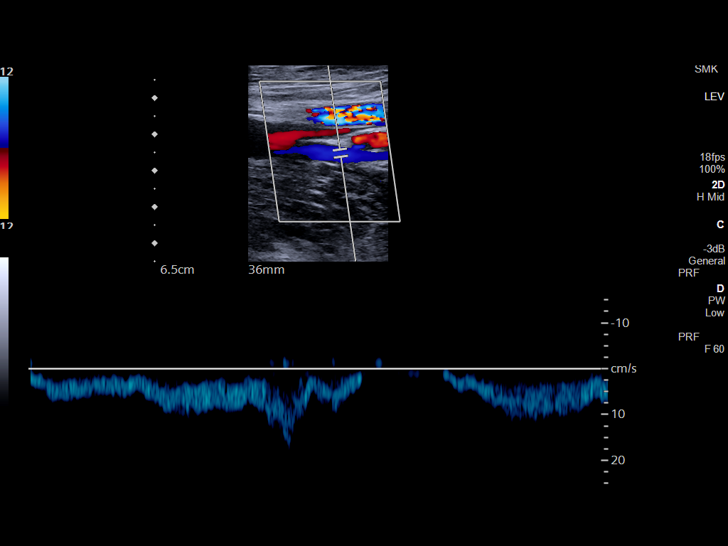
[im 16/33]
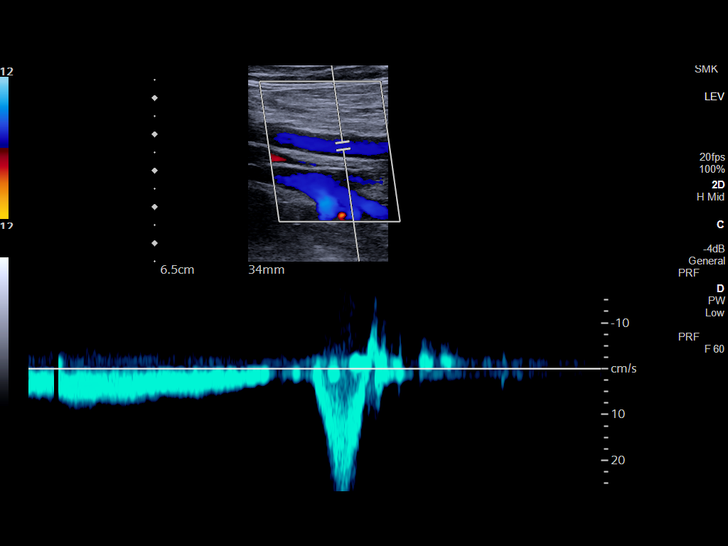
[im 17/33]
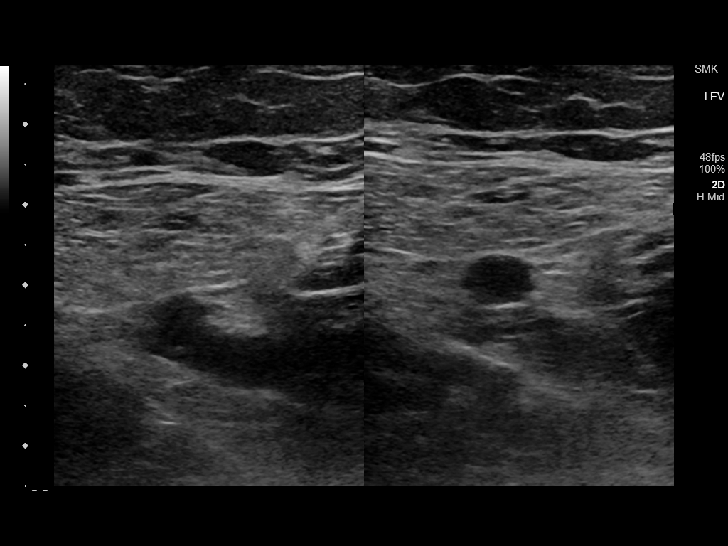
[im 20/33]
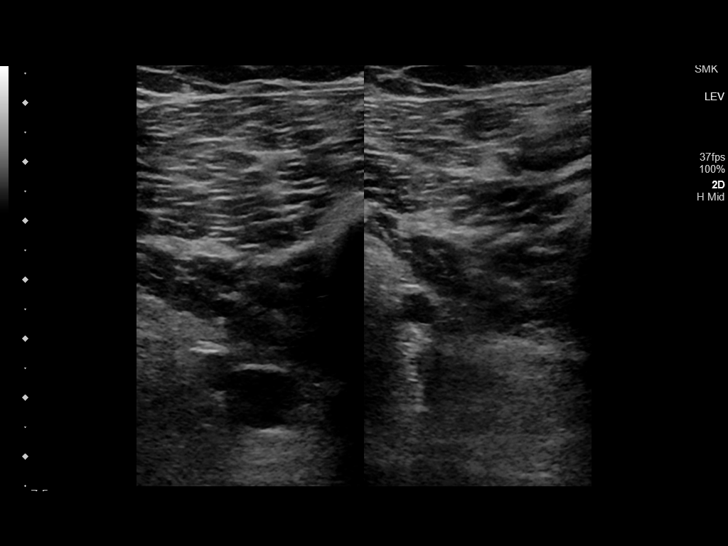
[im 23/33]
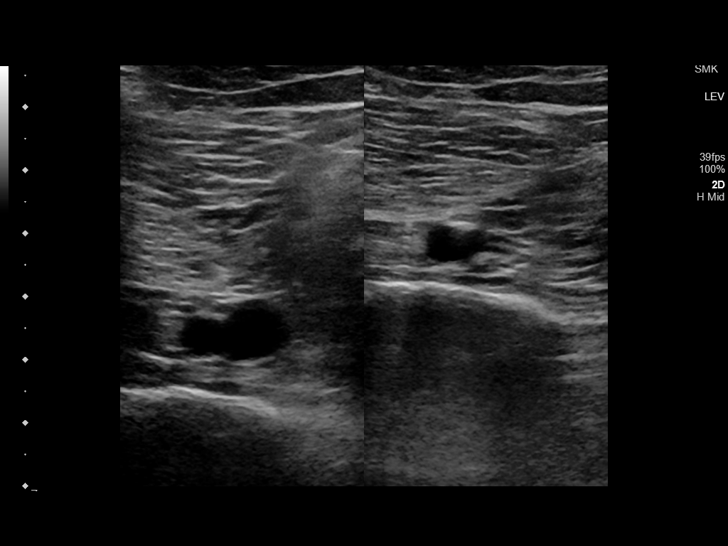
[im 26/33]
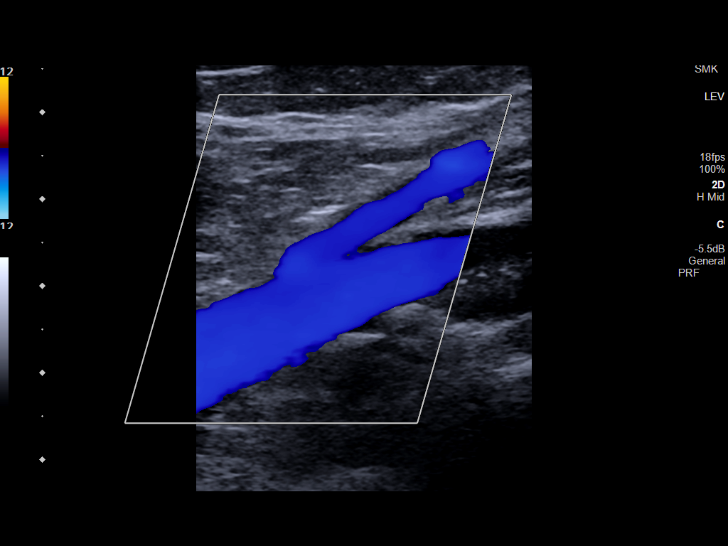
[im 27/33]
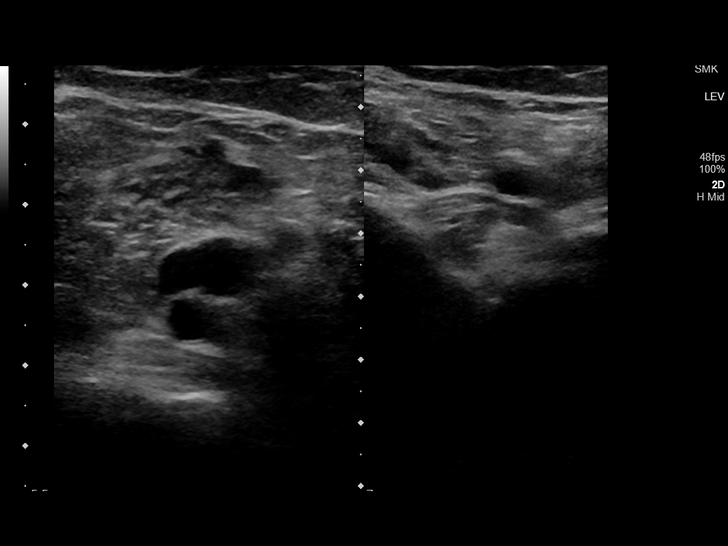
[im 30/33]
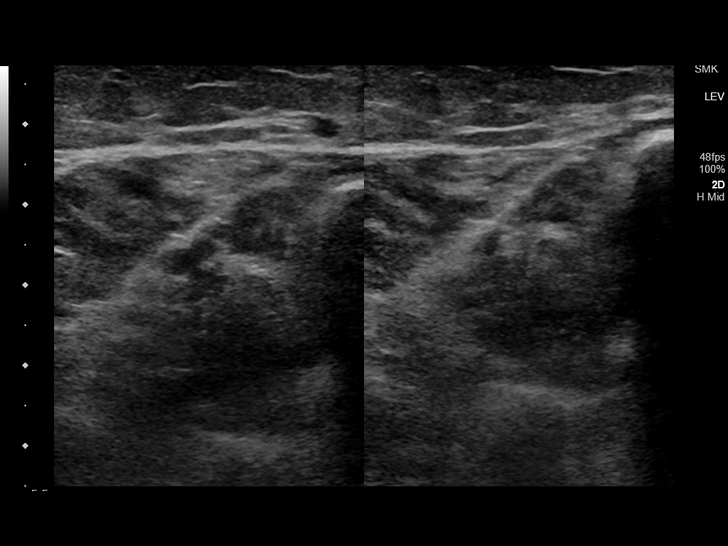
[im 33/33]
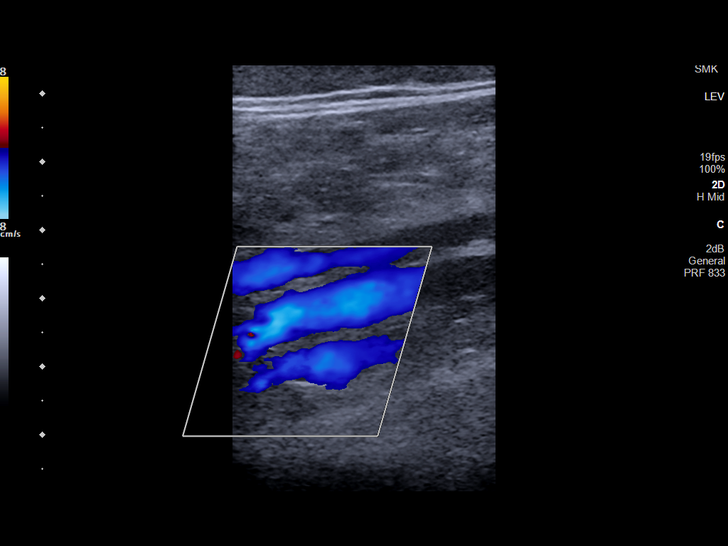

[14 of 24 positions shown; findings below may reference images not displayed]

FINDINGS: VENOUS

Normal compressibility of the common femoral, superficial femoral,
and popliteal veins, as well as the visualized calf veins.
Visualized portions of profunda femoral vein and great saphenous
vein unremarkable. No filling defects to suggest DVT on grayscale or
color Doppler imaging. Doppler waveforms show normal direction of
venous flow, normal respiratory plasticity and response to
augmentation.

Limited views of the contralateral common femoral vein are
unremarkable.

OTHER

None.

Limitations: none
IMPRESSION: There is no evidence of deep venous thrombosis in the left lower
extremity.

## 2022-08-11 ENCOUNTER — Inpatient Hospital Stay: Payer: Medicare HMO | Attending: Radiation Oncology

## 2022-08-11 DIAGNOSIS — C61 Malignant neoplasm of prostate: Secondary | ICD-10-CM | POA: Insufficient documentation

## 2022-08-11 LAB — PSA: Prostatic Specific Antigen: 0.13 ng/mL (ref 0.00–4.00)

## 2022-08-18 ENCOUNTER — Ambulatory Visit: Payer: Medicare HMO | Admitting: Radiation Oncology

## 2022-08-23 ENCOUNTER — Ambulatory Visit
Admission: RE | Admit: 2022-08-23 | Discharge: 2022-08-23 | Disposition: A | Discharge status: Home / Self Care | Payer: Medicare HMO | Source: Ambulatory Visit | Attending: Radiation Oncology | Admitting: Radiation Oncology

## 2022-08-23 VITALS — BP 164/75 | HR 66 | Temp 98.6°F | Resp 16 | Wt 225.2 lb

## 2022-08-23 DIAGNOSIS — C61 Malignant neoplasm of prostate: Secondary | ICD-10-CM

## 2022-08-23 NOTE — Progress Notes (Signed)
Radiation Oncology Follow up Note  Name: Samuel Arnold   Date:   08/23/2022 MRN:  [id number] DOB: 17-Apr-1950    This 72 y.o. male presents to the clinic today for over 4-year follow-up status post IMRT to his prostate for Gleason 8 (4+4) presenting with a PSA in the 6 range.  REFERRING PROVIDER: Altamease Oiler, FNP  HPI: Patient is a 72 year old male now out over 4 years having completed IMRT radiation therapy to his prostate for Gleason 8 adenocarcinoma.  He is seen today in routine follow-up and is doing well.  He has nocturia x 3 otherwise specifically denies any increased lower urinary tract symptoms diarrhea or fatigue.  His most recent PSA is 0.13.  This continues a downward trend over time.  COMPLICATIONS OF TREATMENT: none  FOLLOW UP COMPLIANCE: keeps appointments   PHYSICAL EXAM:  BP (!) 164/75 (BP Location: Left Arm, Patient Position: Sitting, Cuff Size: Normal)   Pulse 66   Temp 98.6 F (37 C) (Tympanic)   Resp 16   Wt 225 lb 3.2 oz (102.2 kg)   SpO2 99%   BMI 35.27 kg/m  Well-developed well-nourished patient in NAD. HEENT reveals PERLA, EOMI, discs not visualized.  Oral cavity is clear. No oral mucosal lesions are identified. Neck is clear without evidence of cervical or supraclavicular adenopathy. Lungs are clear to A&P. Cardiac examination is essentially unremarkable with regular rate and rhythm without murmur rub or thrill. Abdomen is benign with no organomegaly or masses noted. Motor sensory and DTR levels are equal and symmetric in the upper and lower extremities. Cranial nerves II through XII are grossly intact. Proprioception is intact. No peripheral adenopathy or edema is identified. No motor or sensory levels are noted. Crude visual fields are within normal range.  RADIOLOGY RESULTS: No current films for review  PLAN: Present time patient is under excellent biochemical control with continuing downward trend of his PSA over time now out over 4 years.  At this  time I am going to turn follow-up care over to his PMD.  Be happy to reevaluate him at any time should that be indicated.  Patient is to call at anytime with any concerns.  I would like to take this opportunity to thank you for allowing me to participate in the care of your patient.Carmina Miller, MD
